# Patient Record
Sex: Male | Born: 1960 | Race: White | Hispanic: No | Marital: Married | Smoking: Former smoker
Health system: Southern US, Community
[De-identification: ages and names within clinical notes are randomized; demographics above are authoritative.]

## PROBLEM LIST (undated history)

## (undated) DIAGNOSIS — G4730 Sleep apnea, unspecified: Secondary | ICD-10-CM

## (undated) DIAGNOSIS — I10 Essential (primary) hypertension: Secondary | ICD-10-CM

## (undated) DIAGNOSIS — K219 Gastro-esophageal reflux disease without esophagitis: Secondary | ICD-10-CM

## (undated) HISTORY — PX: NO PAST SURGERIES: SHX2092

---

## 2010-08-21 ENCOUNTER — Ambulatory Visit (HOSPITAL_COMMUNITY)
Admission: RE | Admit: 2010-08-21 | Discharge: 2010-08-21 | Payer: Self-pay | Source: Home / Self Care | Attending: Internal Medicine | Admitting: Internal Medicine

## 2010-08-21 ENCOUNTER — Ambulatory Visit: Admit: 2010-08-21 | Payer: Self-pay | Admitting: Internal Medicine

## 2010-09-02 NOTE — Op Note (Signed)
  [name], [name]               ACCOUNT NO.:  [id number]  MEDICAL RECORD NO.:  [id number]          PATIENT TYPE:  AMB  LOCATION:  DAY                           FACILITY:  [facility]  PHYSICIAN:  Kurt Powell, M.D.    DATE OF BIRTH:  09-02-1960  DATE OF PROCEDURE:  08/21/2010 DATE OF DISCHARGE:                              OPERATIVE REPORT   PROCEDURE:  Colonoscopy with snare polypectomy.  INDICATION:  Kurt Powell is a 50 year old Caucasian male who is undergoing average risk screening colonoscopy.  Procedure risks were reviewed with the patient and informed consent was obtained.  MEDICATIONS FOR CONSCIOUS SEDATION: 1. Demerol 50 mg IV. 2. Versed 5 mg IV.  FINDINGS:  Procedure performed in endoscopy suite.  The patient's vital signs and O2 sat were monitored during the procedure and remained stable.  The patient was placed in left lateral recumbent position. Rectal examination performed.  No abnormality noted on external or digital exam.  Pentax videoscope was placed through rectum and advanced under vision into sigmoid colon beyond.  Preparation was excellent. There was a single diverticulum at sigmoid colon.  Scope was passed into cecum which was identified by appendiceal orifice and ileocecal valve. Pictures were taken for the record.  As the scope was withdrawn, colonic mucosa was carefully examined.  There was 4-mm polyp at distal transverse colon which was cold snared.  Rest of the mucosa was normal until back in the cecum where there was 12 to 13 mm lobulated polyp which was snared and retrieved for sludge examination.  Polypectomy was complete.  There was another small polyp at distal sigmoid colon which was coagulated.  Rest of the rectal mucosa was normal.  Anorectal junction was also examined by retroflexing scope and was normal. Endoscope was withdrawn.  Withdrawal time was 16 minutes.  The patient tolerated the procedure well.  FINAL DIAGNOSIS:  Examination performed to  cecum.  A single diverticulum at sigmoid colon.  A 4-mm polyp cold snared from transverse colon.  A 12 to 13 mm lobulated polyp snared from the rectum.  Small polyp at distal sigmoid colon was coagulated.  RECOMMENDATIONS:  No aspirin for 10 days.  I will be contacting the patient results of biopsy and further recommendations.     Kurt Powell, M.D.     NR/MEDQ  D:  08/21/2010  T:  08/22/2010  Job:  [id number]  cc:   Kirk Ruths, M.D. Fax: [phone]  Electronically Signed by Kurt Powell M.D. on 09/02/2010 12:17:17 PM

## 2011-07-29 ENCOUNTER — Ambulatory Visit (HOSPITAL_COMMUNITY)
Admission: RE | Admit: 2011-07-29 | Discharge: 2011-07-29 | Disposition: A | Discharge status: Home / Self Care | Payer: BC Managed Care – PPO | Source: Ambulatory Visit | Attending: Internal Medicine | Admitting: Internal Medicine

## 2011-07-29 ENCOUNTER — Other Ambulatory Visit (HOSPITAL_COMMUNITY): Payer: Self-pay | Admitting: Internal Medicine

## 2011-07-29 DIAGNOSIS — M76899 Other specified enthesopathies of unspecified lower limb, excluding foot: Secondary | ICD-10-CM

## 2011-07-29 DIAGNOSIS — M25569 Pain in unspecified knee: Secondary | ICD-10-CM | POA: Insufficient documentation

## 2015-08-27 ENCOUNTER — Encounter (INDEPENDENT_AMBULATORY_CARE_PROVIDER_SITE_OTHER): Payer: Self-pay | Admitting: *Deleted

## 2015-09-20 ENCOUNTER — Other Ambulatory Visit (INDEPENDENT_AMBULATORY_CARE_PROVIDER_SITE_OTHER): Payer: Self-pay | Admitting: *Deleted

## 2015-09-20 DIAGNOSIS — Z86010 Personal history of colonic polyps: Secondary | ICD-10-CM

## 2015-10-16 ENCOUNTER — Encounter (INDEPENDENT_AMBULATORY_CARE_PROVIDER_SITE_OTHER): Payer: Self-pay | Admitting: *Deleted

## 2015-10-16 ENCOUNTER — Other Ambulatory Visit (INDEPENDENT_AMBULATORY_CARE_PROVIDER_SITE_OTHER): Payer: Self-pay | Admitting: *Deleted

## 2015-10-16 MED ORDER — PEG-KCL-NACL-NASULF-NA ASC-C 100 G PO SOLR: 1.0000 | Freq: Once | ORAL | Status: DC

## 2015-10-16 NOTE — Telephone Encounter (Signed)
Patient needs movi prep 

## 2015-11-08 ENCOUNTER — Telehealth (INDEPENDENT_AMBULATORY_CARE_PROVIDER_SITE_OTHER): Payer: Self-pay | Admitting: *Deleted

## 2015-11-08 NOTE — Telephone Encounter (Signed)
agree

## 2015-11-08 NOTE — Telephone Encounter (Signed)
Referring MD/PCP: ben mann -- [facility]   Procedure: tcs  Reason/Indication:  Hx polyps  Has patient had this procedure before?  Yes, 2012 -- paper chart  If so, when, by whom and where?    Is there a family history of colon cancer?  no  Who?  What age when diagnosed?    Is patient diabetic?   no      Does patient have prosthetic heart valve or mechanical valve?  no  Do you have a pacemaker?  no  Has patient ever had endocarditis? no  Has patient had joint replacement within last 12 months?  no  Does patient tend to be constipated or take laxatives? no  Does patient have a history of alcohol/drug use?  no  Is patient on Coumadin, Plavix and/or Aspirin? no  Medications: amlodipine 5/20 mg daily, vitamins  Allergies: nkda  Medication Adjustment:   Procedure date & time: 11/29/15 at 930

## 2015-11-29 ENCOUNTER — Ambulatory Visit (HOSPITAL_COMMUNITY)
Admission: RE | Admit: 2015-11-29 | Discharge: 2015-11-29 | Disposition: A | Discharge status: Home / Self Care | Payer: BLUE CROSS/BLUE SHIELD | Source: Ambulatory Visit | Attending: Internal Medicine | Admitting: Internal Medicine

## 2015-11-29 ENCOUNTER — Encounter (HOSPITAL_COMMUNITY)
Admission: RE | Disposition: A | Discharge status: Home / Self Care | Payer: Self-pay | Source: Ambulatory Visit | Attending: Internal Medicine

## 2015-11-29 ENCOUNTER — Encounter (HOSPITAL_COMMUNITY): Payer: Self-pay

## 2015-11-29 DIAGNOSIS — K621 Rectal polyp: Secondary | ICD-10-CM | POA: Insufficient documentation

## 2015-11-29 DIAGNOSIS — Z1211 Encounter for screening for malignant neoplasm of colon: Secondary | ICD-10-CM | POA: Insufficient documentation

## 2015-11-29 DIAGNOSIS — I10 Essential (primary) hypertension: Secondary | ICD-10-CM | POA: Diagnosis not present

## 2015-11-29 DIAGNOSIS — Z09 Encounter for follow-up examination after completed treatment for conditions other than malignant neoplasm: Secondary | ICD-10-CM | POA: Diagnosis not present

## 2015-11-29 DIAGNOSIS — K5730 Diverticulosis of large intestine without perforation or abscess without bleeding: Secondary | ICD-10-CM | POA: Diagnosis not present

## 2015-11-29 DIAGNOSIS — K6389 Other specified diseases of intestine: Secondary | ICD-10-CM | POA: Diagnosis not present

## 2015-11-29 DIAGNOSIS — Z87891 Personal history of nicotine dependence: Secondary | ICD-10-CM | POA: Diagnosis not present

## 2015-11-29 DIAGNOSIS — Z86010 Personal history of colonic polyps: Secondary | ICD-10-CM | POA: Diagnosis not present

## 2015-11-29 DIAGNOSIS — Z79899 Other long term (current) drug therapy: Secondary | ICD-10-CM | POA: Diagnosis not present

## 2015-11-29 HISTORY — DX: Essential (primary) hypertension: I10

## 2015-11-29 HISTORY — PX: COLONOSCOPY: SHX5424

## 2015-11-29 SURGERY — COLONOSCOPY
Anesthesia: Moderate Sedation

## 2015-11-29 MED ORDER — MIDAZOLAM HCL 5 MG/5ML IJ SOLN
INTRAMUSCULAR | Status: DC | PRN
  Administered 2015-11-29 (×4): 2 mg via INTRAVENOUS
  Administered 2015-11-29: 1 mg via INTRAVENOUS

## 2015-11-29 MED ORDER — STERILE WATER FOR IRRIGATION IR SOLN
Status: DC | PRN
  Administered 2015-11-29: 09:00:00

## 2015-11-29 MED ORDER — MEPERIDINE HCL 50 MG/ML IJ SOLN
INTRAMUSCULAR | Status: AC
  Filled 2015-11-29: qty 1

## 2015-11-29 MED ORDER — MEPERIDINE HCL 50 MG/ML IJ SOLN
INTRAMUSCULAR | Status: DC | PRN
  Administered 2015-11-29 (×2): 25 mg via INTRAVENOUS

## 2015-11-29 MED ORDER — MIDAZOLAM HCL 5 MG/5ML IJ SOLN
INTRAMUSCULAR | Status: AC
  Filled 2015-11-29: qty 10

## 2015-11-29 MED ORDER — SODIUM CHLORIDE 0.9 % IV SOLN
INTRAVENOUS | Status: DC
  Administered 2015-11-29: 09:00:00 via INTRAVENOUS

## 2015-11-29 NOTE — H&P (Signed)
Kurt Powell is an 55 y.o. male.   Chief Complaint: Patient is here for colonoscopy. HPI: This 55 year old Caucasian male who is here for surveillance colonoscopy. He has history of colonic adenomas. 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 He denies abdominal pain until bleeding or change in bowel habits. Family History is negative for CRC.  Past Medical History  Diagnosis Date  . Hypertension     Past Surgical History  Procedure Laterality Date  . No past surgeries      History reviewed. No pertinent family history. Social History:  reports that he quit smoking about 33 years ago. He does not have any smokeless tobacco history on file. He reports that he does not drink alcohol or use illicit drugs.  Allergies: No Known Allergies  Medications Prior to Admission  Medication Sig Dispense Refill  . amLODipine-olmesartan (AZOR) 5-20 MG tablet Take 1 tablet by mouth daily.  0  . Ascorbic Acid (VITAMIN C PO) Take 1 tablet by mouth daily.    Marland Kitchen GARLIC PO Take 1 capsule by mouth daily.    . Omega-3 Fatty Acids (FISH OIL PO) Take 1 capsule by mouth daily.    . peg 3350 powder (MOVIPREP) 100 g SOLR Take 1 kit (200 g total) by mouth once. 1 kit 0    No results found for this or any previous visit (from the past 48 hour(s)). No results found.  ROS  Blood pressure 112/81, pulse 73, temperature 98.5 F (36.9 C), temperature source Oral, resp. rate 12, height 5' 7" (1.702 m), weight 230 lb (104.327 kg), SpO2 96 %. Physical Exam  Constitutional: He appears well-developed and well-nourished.  HENT:  Mouth/Throat: Oropharynx is clear and moist.  Eyes: Conjunctivae are normal. No scleral icterus.  Neck: No thyromegaly present.  Cardiovascular: Normal rate, regular rhythm and normal heart sounds.   No murmur heard. Respiratory: Effort normal and breath sounds normal.  GI: Soft. He exhibits  no distension and no mass. There is no tenderness.  Musculoskeletal: He exhibits no edema.  Lymphadenopathy:    He has no cervical adenopathy.  Neurological: He is alert.  Skin: Skin is warm and dry.     Assessment/Plan History of colonic adenomas. Surveillance colonoscopy.  Rogene Houston, MD 11/29/2015, 9:21 AM

## 2015-11-29 NOTE — Discharge Instructions (Signed)
Resume usual medications and high fiber diet. No driving for 24 hours. Physician will call with biopsy results.    Colonoscopy, Care After These instructions give you information on caring for yourself after your procedure. Your doctor may also give you more specific instructions. Call your doctor if you have any problems or questions after your procedure. HOME CARE  Do not drive for 24 hours.  Do not sign important papers or use machinery for 24 hours.  You may shower.  You may go back to your usual activities, but go slower for the first 24 hours.  Take rest breaks often during the first 24 hours.  Walk around or use warm packs on your belly (abdomen) if you have belly cramping or gas.  Drink enough fluids to keep your pee (urine) clear or pale yellow.  Resume your normal diet. Avoid heavy or fried foods.  Avoid drinking alcohol for 24 hours or as told by your doctor.  Only take medicines as told by your doctor. If a tissue sample (biopsy) was taken during the procedure:   Do not take aspirin or blood thinners for 7 days, or as told by your doctor.  Do not drink alcohol for 7 days, or as told by your doctor.  Eat soft foods for the first 24 hours. GET HELP IF: You still have a small amount of blood in your poop (stool) 2-3 days after the procedure. GET HELP RIGHT AWAY IF:  You have more than a small amount of blood in your poop.  You see clumps of tissue (blood clots) in your poop.  Your belly is puffy (swollen).  You feel sick to your stomach (nauseous) or throw up (vomit).  You have a fever.  You have belly pain that gets worse and medicine does not help. MAKE SURE YOU:  Understand these instructions.  Will watch your condition.  Will get help right away if you are not doing well or get worse.   This information is not intended to replace advice given to you by your health care provider. Make sure you discuss any questions you have with your health care  provider.   Document Released: 08/30/2010 Document Revised: 08/02/2013 Document Reviewed: 04/04/2013 Elsevier Interactive Patient Education 2016 Elsevier Inc.    High-Fiber Diet Fiber, also called dietary fiber, is a type of carbohydrate found in fruits, vegetables, whole grains, and beans. A high-fiber diet can have many health benefits. Your health care provider may recommend a high-fiber diet to help:  Prevent constipation. Fiber can make your bowel movements more regular.  Lower your cholesterol.  Relieve hemorrhoids, uncomplicated diverticulosis, or irritable bowel syndrome.  Prevent overeating as part of a weight-loss plan.  Prevent heart disease, type 2 diabetes, and certain cancers. WHAT IS MY PLAN? The recommended daily intake of fiber includes:  38 grams for men under age 90.  7 grams for men over age 55.  3 grams for women under age 27.  55 grams for women over age 3. You can get the recommended daily intake of dietary fiber by eating a variety of fruits, vegetables, grains, and beans. Your health care provider may also recommend a fiber supplement if it is not possible to get enough fiber through your diet. WHAT DO I NEED TO KNOW ABOUT A HIGH-FIBER DIET?  Fiber supplements have not been widely studied for their effectiveness, so it is better to get fiber through food sources.  Always check the fiber content on thenutrition facts label of any prepackaged  food. Look for foods that contain at least 5 grams of fiber per serving.  Ask your dietitian if you have questions about specific foods that are related to your condition, especially if those foods are not listed in the following section.  Increase your daily fiber consumption gradually. Increasing your intake of dietary fiber too quickly may cause bloating, cramping, or gas.  Drink plenty of water. Water helps you to digest fiber. WHAT FOODS CAN I EAT? Grains Whole-grain breads. Multigrain cereal. Oats and  oatmeal. Brown rice. Barley. Bulgur wheat. Gurley. Bran muffins. Popcorn. Rye wafer crackers. Vegetables Sweet potatoes. Spinach. Kale. Artichokes. Cabbage. Broccoli. Green peas. Carrots. Squash. Fruits Berries. Pears. Apples. Oranges. Avocados. Prunes and raisins. Dried figs. Meats and Other Protein Sources Navy, kidney, pinto, and soy beans. Split peas. Lentils. Nuts and seeds. Dairy Fiber-fortified yogurt. Beverages Fiber-fortified soy milk. Fiber-fortified orange juice. Other Fiber bars. The items listed above may not be a complete list of recommended foods or beverages. Contact your dietitian for more options. WHAT FOODS ARE NOT RECOMMENDED? Grains White bread. Pasta made with refined flour. White rice. Vegetables Fried potatoes. Canned vegetables. Well-cooked vegetables.  Fruits Fruit juice. Cooked, strained fruit. Meats and Other Protein Sources Fatty cuts of meat. Fried Sales executive or fried fish. Dairy Milk. Yogurt. Cream cheese. Sour cream. Beverages Soft drinks. Other Cakes and pastries. Butter and oils. The items listed above may not be a complete list of foods and beverages to avoid. Contact your dietitian for more information. WHAT ARE SOME TIPS FOR INCLUDING HIGH-FIBER FOODS IN MY DIET?  Eat a wide variety of high-fiber foods.  Make sure that half of all grains consumed each day are whole grains.  Replace breads and cereals made from refined flour or white flour with whole-grain breads and cereals.  Replace white rice with brown rice, bulgur wheat, or millet.  Start the day with a breakfast that is high in fiber, such as a cereal that contains at least 5 grams of fiber per serving.  Use beans in place of meat in soups, salads, or pasta.  Eat high-fiber snacks, such as berries, raw vegetables, nuts, or popcorn.   This information is not intended to replace advice given to you by your health care provider. Make sure you discuss any questions you have with your  health care provider.   Document Released: 07/28/2005 Document Revised: 08/18/2014 Document Reviewed: 01/10/2014 Elsevier Interactive Patient Education 2016 Elsevier Inc.   Colon Polyps Polyps are lumps of extra tissue growing inside the body. Polyps can grow in the large intestine (colon). Most colon polyps are noncancerous (benign). However, some colon polyps can become cancerous over time. Polyps that are larger than a pea may be harmful. To be safe, caregivers remove and test all polyps. CAUSES  Polyps form when mutations in the genes cause your cells to grow and divide even though no more tissue is needed. RISK FACTORS There are a number of risk factors that can increase your chances of getting colon polyps. They include:  Being older than 50 years.  Family history of colon polyps or colon cancer.  Long-term colon diseases, such as colitis or Crohn disease.  Being overweight.  Smoking.  Being inactive.  Drinking too much alcohol. SYMPTOMS  Most small polyps do not cause symptoms. If symptoms are present, they may include:  Blood in the stool. The stool may look dark red or black.  Constipation or diarrhea that lasts longer than 1 week. DIAGNOSIS People often do not know they  have polyps until their caregiver finds them during a regular checkup. Your caregiver can use 4 tests to check for polyps:  Digital rectal exam. The caregiver wears gloves and feels inside the rectum. This test would find polyps only in the rectum.  Barium enema. The caregiver puts a liquid called barium into your rectum before taking X-rays of your colon. Barium makes your colon look white. Polyps are dark, so they are easy to see in the X-ray pictures.  Sigmoidoscopy. A thin, flexible tube (sigmoidoscope) is placed into your rectum. The sigmoidoscope has a light and tiny camera in it. The caregiver uses the sigmoidoscope to look at the last third of your colon.  Colonoscopy. This test is like  sigmoidoscopy, but the caregiver looks at the entire colon. This is the most common method for finding and removing polyps. TREATMENT  Any polyps will be removed during a sigmoidoscopy or colonoscopy. The polyps are then tested for cancer. PREVENTION  To help lower your risk of getting more colon polyps:  Eat plenty of fruits and vegetables. Avoid eating fatty foods.  Do not smoke.  Avoid drinking alcohol.  Exercise every day.  Lose weight if recommended by your caregiver.  Eat plenty of calcium and folate. Foods that are rich in calcium include milk, cheese, and broccoli. Foods that are rich in folate include chickpeas, kidney beans, and spinach. HOME CARE INSTRUCTIONS Keep all follow-up appointments as directed by your caregiver. You may need periodic exams to check for polyps. SEEK MEDICAL CARE IF: You notice bleeding during a bowel movement.   This information is not intended to replace advice given to you by your health care provider. Make sure you discuss any questions you have with your health care provider.   Document Released: 04/23/2004 Document Revised: 08/18/2014 Document Reviewed: 10/07/2011 Elsevier Interactive Patient Education [employer].

## 2015-11-29 NOTE — Op Note (Signed)
[facility] Patient Name: Kurt Powell Procedure Date: 11/29/2015 9:17 AM MRN: [id number] Date of Birth: 1961/01/24 Attending MD: Hildred Laser , MD CSN: [id number] Age: 55 Admit Type: Outpatient Procedure:                Colonoscopy Indications:              Surveillance: Personal history of adenomatous                            polyps on last colonoscopy 5 years ago Providers:                Hildred Laser, MD, Rosina Lowenstein, RN, Randa Spike, Technician Referring MD:             Collene Mares, PA Medicines:                Meperidine 50 mg IV, Midazolam 9 mg IV Complications:            No immediate complications. Estimated Blood Loss:     Estimated blood loss was minimal. Procedure:                Pre-Anesthesia Assessment:                           - Prior to the procedure, a History and Physical                            was performed, and patient medications and                            allergies were reviewed. The patient's tolerance of                            previous anesthesia was also reviewed. The risks                            and benefits of the procedure and the sedation                            options and risks were discussed with the patient.                            All questions were answered, and informed consent                            was obtained. Prior Anticoagulants: The patient has                            taken no previous anticoagulant or antiplatelet                            agents. ASA Grade Assessment: I - A normal, healthy  patient. After reviewing the risks and benefits,                            the patient was deemed in satisfactory condition to                            undergo the procedure.                           After obtaining informed consent, the colonoscope                            was passed under direct vision. Throughout the   procedure, the patient's blood pressure, pulse, and                            oxygen saturations were monitored continuously. The                            [id number]) scope was introduced through                            the anus and advanced to the the terminal ileum.                            The colonoscopy was performed without difficulty.                            The patient tolerated the procedure well. The                            quality of the bowel preparation was adequate. The                            terminal ileum, ileocecal valve, appendiceal                            orifice, and rectum were photographed. Scope In: 9:31:13 AM Scope Out: 9:58:38 AM Scope Withdrawal Time: 0 hours 18 minutes 44 seconds  Total Procedure Duration: 0 hours 27 minutes 25 seconds  Findings:      The terminal ileum appeared normal.      A localized area of mildly erythematous mucosa was found at the       ileocecal valve. This was biopsied with a cold forceps for histology.      A few medium-mouthed diverticula were found in the sigmoid colon.      Two sessile polyps were found in the rectum. The polyps were 4 to 5 mm       in size. These polyps were removed with a cold snare. Resection and       retrieval were complete.      The retroflexed view of the distal rectum and anal verge was normal and       showed no anal or rectal abnormalities. Impression:               - The examined portion of the  ileum was normal.                           - Erythematous mucosa at the ileocecal valve.                            Biopsied.                           - Diverticulosis in the sigmoid colon.                           - Two 4 to 5 mm polyps in the rectum, removed with                            a cold snare. Resected and retrieved.                           - The distal rectum and anal verge are normal on                            retroflexion view. Moderate Sedation:       Moderate (conscious) sedation was administered by the endoscopy nurse       and supervised by the endoscopist. The following parameters were       monitored: oxygen saturation, heart rate, blood pressure, CO2       capnography and response to care. Total physician intraservice time was       33 minutes. Recommendation:           - Patient has a contact number available for                            emergencies. The signs and symptoms of potential                            delayed complications were discussed with the                            patient. Return to normal activities tomorrow.                            Written discharge instructions were provided to the                            patient.                           - High fiber diet today.                           - Continue present medications.                           - Repeat colonoscopy date to be determined after  pending pathology results are reviewed for                            surveillance based on pathology results. Procedure Code(s):        --- Professional ---                           [phone], Colonoscopy, flexible; with removal of                            tumor(s), polyp(s), or other lesion(s) by snare                            technique                           45380, 59, Colonoscopy, flexible; with biopsy,                            single or multiple                           99152, Moderate sedation services provided by the                            same physician or other qualified health care                            professional performing the diagnostic or                            therapeutic service that the sedation supports,                            requiring the presence of an independent trained                            observer to assist in the monitoring of the                            patient's level of consciousness and physiological                             status; initial 15 minutes of intraservice time,                            patient age 60 years or older                           [phone], Moderate sedation services; each additional                            15 minutes intraservice time Diagnosis Code(s):        --- Professional ---  Z86.010, Personal history of colonic polyps                           K63.89, Other specified diseases of intestine                           K62.1, Rectal polyp                           K57.30, Diverticulosis of large intestine without                            perforation or abscess without bleeding CPT copyright 2016 American Medical Association. All rights reserved. The codes documented in this report are preliminary and upon coder review may  be revised to meet current compliance requirements. Hildred Laser, MD Hildred Laser, MD 11/29/2015 10:06:51 AM This report has been signed electronically. Number of Addenda: 0

## 2015-11-30 ENCOUNTER — Encounter (HOSPITAL_COMMUNITY): Payer: Self-pay | Admitting: Internal Medicine

## 2015-12-24 DIAGNOSIS — H6692 Otitis media, unspecified, left ear: Secondary | ICD-10-CM | POA: Diagnosis not present

## 2015-12-24 DIAGNOSIS — K219 Gastro-esophageal reflux disease without esophagitis: Secondary | ICD-10-CM | POA: Diagnosis not present

## 2015-12-24 DIAGNOSIS — Z1389 Encounter for screening for other disorder: Secondary | ICD-10-CM | POA: Diagnosis not present

## 2015-12-24 DIAGNOSIS — H6123 Impacted cerumen, bilateral: Secondary | ICD-10-CM | POA: Diagnosis not present

## 2015-12-24 DIAGNOSIS — Z6835 Body mass index (BMI) 35.0-35.9, adult: Secondary | ICD-10-CM | POA: Diagnosis not present

## 2016-02-08 DIAGNOSIS — I10 Essential (primary) hypertension: Secondary | ICD-10-CM | POA: Diagnosis not present

## 2016-02-08 DIAGNOSIS — H8113 Benign paroxysmal vertigo, bilateral: Secondary | ICD-10-CM | POA: Diagnosis not present

## 2016-02-08 DIAGNOSIS — K219 Gastro-esophageal reflux disease without esophagitis: Secondary | ICD-10-CM | POA: Diagnosis not present

## 2016-02-08 DIAGNOSIS — J309 Allergic rhinitis, unspecified: Secondary | ICD-10-CM | POA: Diagnosis not present

## 2016-02-08 DIAGNOSIS — Z1389 Encounter for screening for other disorder: Secondary | ICD-10-CM | POA: Diagnosis not present

## 2016-02-08 DIAGNOSIS — Z6835 Body mass index (BMI) 35.0-35.9, adult: Secondary | ICD-10-CM | POA: Diagnosis not present

## 2016-02-27 DIAGNOSIS — R42 Dizziness and giddiness: Secondary | ICD-10-CM | POA: Diagnosis not present

## 2016-09-16 ENCOUNTER — Encounter: Payer: Self-pay | Admitting: Internal Medicine

## 2017-01-16 DIAGNOSIS — G4733 Obstructive sleep apnea (adult) (pediatric): Secondary | ICD-10-CM | POA: Diagnosis not present

## 2017-01-16 DIAGNOSIS — Z1389 Encounter for screening for other disorder: Secondary | ICD-10-CM | POA: Diagnosis not present

## 2017-01-16 DIAGNOSIS — D179 Benign lipomatous neoplasm, unspecified: Secondary | ICD-10-CM | POA: Diagnosis not present

## 2017-01-16 DIAGNOSIS — Z6834 Body mass index (BMI) 34.0-34.9, adult: Secondary | ICD-10-CM | POA: Diagnosis not present

## 2017-01-16 DIAGNOSIS — R7301 Impaired fasting glucose: Secondary | ICD-10-CM | POA: Diagnosis not present

## 2017-01-16 DIAGNOSIS — Z0001 Encounter for general adult medical examination with abnormal findings: Secondary | ICD-10-CM | POA: Diagnosis not present

## 2017-01-16 DIAGNOSIS — Z23 Encounter for immunization: Secondary | ICD-10-CM | POA: Diagnosis not present

## 2017-01-27 DIAGNOSIS — D225 Melanocytic nevi of trunk: Secondary | ICD-10-CM | POA: Diagnosis not present

## 2017-01-27 DIAGNOSIS — Z1283 Encounter for screening for malignant neoplasm of skin: Secondary | ICD-10-CM | POA: Diagnosis not present

## 2017-02-17 ENCOUNTER — Ambulatory Visit (INDEPENDENT_AMBULATORY_CARE_PROVIDER_SITE_OTHER): Payer: BLUE CROSS/BLUE SHIELD | Admitting: General Surgery

## 2017-02-17 ENCOUNTER — Encounter: Payer: Self-pay | Admitting: General Surgery

## 2017-02-17 VITALS — BP 153/94 | HR 80 | Temp 98.7°F | Resp 18 | Ht 67.0 in | Wt 236.0 lb

## 2017-02-17 DIAGNOSIS — D171 Benign lipomatous neoplasm of skin and subcutaneous tissue of trunk: Secondary | ICD-10-CM | POA: Diagnosis not present

## 2017-02-17 NOTE — Progress Notes (Signed)
Kurt Powell; [id number]; 09/16/1960   HPI Patient is a 56-year-old white male who was referred to my care for evaluation treatment of a subcutaneous nodule on his right flank. It is been present for some time. It seems to different size over the past few years. It is occasionally tender to touch. Only when pressure is applied. He states there may have been some growth over the past year, but he is not certain. No drainage has been noted. He currently has no pain. Past Medical History:  Diagnosis Date  . Hypertension     Past Surgical History:  Procedure Laterality Date  . COLONOSCOPY N/A 11/29/2015   Procedure: COLONOSCOPY;  Surgeon: Najeeb U Rehman, MD;  Location: [facility];  Service: Endoscopy;  Laterality: N/A;  930  . NO PAST SURGERIES      History reviewed. No pertinent family history.  Current Outpatient Prescriptions on File Prior to Visit  Medication Sig Dispense Refill  . amLODipine-olmesartan (AZOR) 5-20 MG tablet Take 1 tablet by mouth daily.  0  . Ascorbic Acid (VITAMIN C PO) Take 1 tablet by mouth daily.    . GARLIC PO Take 1 capsule by mouth daily.    . Omega-3 Fatty Acids (FISH OIL PO) Take 1 capsule by mouth daily.     No current facility-administered medications on file prior to visit.     No Known Allergies  History  Alcohol Use No    History  Smoking Status  . Former Smoker  . Quit date: 11/29/1982  Smokeless Tobacco  . Never Used    Review of Systems  Constitutional: Negative.   HENT: Negative.   Eyes: Negative.   Respiratory:       Uses CPAP machine  Cardiovascular: Negative.   Gastrointestinal: Negative.   Genitourinary: Negative.   Musculoskeletal: Negative.   Skin: Positive for rash.  Neurological: Positive for dizziness.  Endo/Heme/Allergies: Negative.   Psychiatric/Behavioral: Negative.     Objective   Vitals:   02/17/17 1001  BP: (!) 153/94  Pulse: 80  Resp: 18  Temp: 98.7 F (37.1 C)    Physical Exam   Constitutional: He is oriented to person, place, and time and well-developed, well-nourished, and in no distress.  HENT:  Head: Normocephalic and atraumatic.  Neck: Normal range of motion. Neck supple.  Cardiovascular: Normal rate, regular rhythm and normal heart sounds.   No murmur heard. Pulmonary/Chest: Effort normal and breath sounds normal. He has no wheezes. He has no rales.  Abdominal: Soft. He exhibits mass. He exhibits no distension. There is no tenderness.  6 x 8 cm ovoid subcutaneous rubbery mass present along right flank. Nontender.  Neurological: He is alert and oriented to person, place, and time.  Vitals reviewed.   Assessment  Large lipoma, right flank Plan   I told the patient that the risk of this being malignant was very small. That being said, should he have increased enlargement, he should consider removal. He would like to think about it and will call me to schedule the surgery. Literature given.  

## 2017-02-17 NOTE — Patient Instructions (Signed)
Lipoma Removal Lipoma removal is a surgical procedure to remove a noncancerous (benign) tumor that is made up of fat cells (lipoma). Most lipomas are small and painless and do not require treatment. They can form in many areas of the body but are most common under the skin of the back, shoulders, arms, and thighs. You may need lipoma removal if you have a lipoma that is large, growing, or causing discomfort. Lipoma removal may also be done for cosmetic reasons. Tell a health care provider about:  Any allergies you have.  All medicines you are taking, including vitamins, herbs, eye drops, creams, and over-the-counter medicines.  Any problems you or family members have had with anesthetic medicines.  Any blood disorders you have.  Any surgeries you have had.  Any medical conditions you have.  Whether you are pregnant or may be pregnant. What are the risks? Generally, this is a safe procedure. However, problems may occur, including:  Infection.  Bleeding.  Allergic reactions to medicines.  Damage to nerves or blood vessels near the lipoma.  Scarring.  What happens before the procedure? Staying hydrated Follow instructions from your health care provider about hydration, which may include:  Up to 2 hours before the procedure - you may continue to drink clear liquids, such as water, clear fruit juice, black coffee, and plain tea.  Eating and drinking restrictions Follow instructions from your health care provider about eating and drinking, which may include:  8 hours before the procedure - stop eating heavy meals or foods such as meat, fried foods, or fatty foods.  6 hours before the procedure - stop eating light meals or foods, such as toast or cereal.  6 hours before the procedure - stop drinking milk or drinks that contain milk.  2 hours before the procedure - stop drinking clear liquids.  Medicines  Ask your health care provider about: ? Changing or stopping your  regular medicines. This is especially important if you are taking diabetes medicines or blood thinners. ? Taking medicines such as aspirin and ibuprofen. These medicines can thin your blood. Do not take these medicines before your procedure if your health care provider instructs you not to.  You may be given antibiotic medicine to help prevent infection. General instructions  Ask your health care provider how your surgical site will be marked or identified.  You will have a physical exam. Your health care provider will check the size of the lipoma and whether it can be moved easily.  You may have imaging tests, such as: ? X-rays. ? CT scan. ? MRI.  Plan to have someone take you home from the hospital or clinic. What happens during the procedure?  To reduce your risk of infection: ? Your health care team will wash or sanitize their hands. ? Your skin will be washed with soap.  You will be given one or more of the following: ? A medicine to help you relax (sedative). ? A medicine to numb the area (local anesthetic). ? A medicine to make you fall asleep (general anesthetic). ? A medicine that is injected into an area of your body to numb everything below the injection site (regional anesthetic).  An incision will be made over the lipoma or very near the lipoma. The incision may be made in a natural skin line or crease.  Tissues, nerves, and blood vessels near the lipoma will be moved out of the way.  The lipoma and the capsule that surrounds it will be separated  from the surrounding tissues.  The lipoma will be removed.  The incision may be closed with stitches (sutures).  A bandage (dressing) will be placed over the incision. What happens after the procedure?  Do not drive for 24 hours if you received a sedative.  Your blood pressure, heart rate, breathing rate, and blood oxygen level will be monitored until the medicines you were given have worn off. This information is not  intended to replace advice given to you by your health care provider. Make sure you discuss any questions you have with your health care provider. Document Released: 10/11/2015 Document Revised: 01/03/2016 Document Reviewed: 10/11/2015 Elsevier Interactive Patient Education  2018 [employer] Lipoma A lipoma is a noncancerous (benign) tumor that is made up of fat cells. This is a very common type of soft-tissue growth. Lipomas are usually found under the skin (subcutaneous). They may occur in any tissue of the body that contains fat. Common areas for lipomas to appear include the back, shoulders, buttocks, and thighs. Lipomas grow slowly, and they are usually painless. Most lipomas do not cause problems and do not require treatment. What are the causes? The cause of this condition is not known. What increases the risk? This condition is more likely to develop in:  People who are 40-60 years old.  People who have a family history of lipomas.  What are the signs or symptoms? A lipoma usually appears as a small, round bump under the skin. It may feel soft or rubbery, but the firmness can vary. Most lipomas are not painful. However, a lipoma may become painful if it is located in an area where it pushes on nerves. How is this diagnosed? A lipoma can usually be diagnosed with a physical exam. You may also have tests to confirm the diagnosis and to rule out other conditions. Tests may include:  Imaging tests, such as a CT scan or MRI.  Removal of a tissue sample to be looked at under a microscope (biopsy).  How is this treated? Treatment is not needed for small lipomas that are not causing problems. If a lipoma continues to get bigger or it causes problems, removal is often the best option. Lipomas can also be removed to improve appearance. Removal of a lipoma is usually done with a surgery in which the fatty cells and the surrounding capsule are removed. Most often, a medicine that numbs the  area (local anesthetic) is used for this procedure. Follow these instructions at home:  Keep all follow-up visits as directed by your health care provider. This is important. Contact a health care provider if:  Your lipoma becomes larger or hard.  Your lipoma becomes painful, red, or increasingly swollen. These could be signs of infection or a more serious condition. This information is not intended to replace advice given to you by your health care provider. Make sure you discuss any questions you have with your health care provider. Document Released: 07/18/2002 Document Revised: 01/03/2016 Document Reviewed: 07/24/2014 [employer] Interactive Patient Education  2018 [employer]  

## 2017-04-14 ENCOUNTER — Ambulatory Visit (INDEPENDENT_AMBULATORY_CARE_PROVIDER_SITE_OTHER): Payer: BLUE CROSS/BLUE SHIELD | Admitting: Urology

## 2017-04-14 DIAGNOSIS — R972 Elevated prostate specific antigen [PSA]: Secondary | ICD-10-CM | POA: Diagnosis not present

## 2017-04-29 ENCOUNTER — Other Ambulatory Visit: Payer: Self-pay | Admitting: Urology

## 2017-04-29 DIAGNOSIS — N4342 Spermatocele of epididymis, multiple: Secondary | ICD-10-CM

## 2017-05-04 ENCOUNTER — Other Ambulatory Visit: Payer: Self-pay | Admitting: Urology

## 2017-05-04 DIAGNOSIS — N4342 Spermatocele of epididymis, multiple: Secondary | ICD-10-CM

## 2017-05-06 ENCOUNTER — Ambulatory Visit (HOSPITAL_COMMUNITY)
Admission: RE | Admit: 2017-05-06 | Discharge: 2017-05-06 | Disposition: A | Discharge status: Home / Self Care | Payer: BLUE CROSS/BLUE SHIELD | Source: Ambulatory Visit | Attending: Urology | Admitting: Urology

## 2017-05-06 DIAGNOSIS — N503 Cyst of epididymis: Secondary | ICD-10-CM | POA: Diagnosis not present

## 2017-05-06 DIAGNOSIS — N4342 Spermatocele of epididymis, multiple: Secondary | ICD-10-CM

## 2017-05-06 DIAGNOSIS — N433 Hydrocele, unspecified: Secondary | ICD-10-CM | POA: Insufficient documentation

## 2017-05-29 NOTE — Patient Instructions (Signed)
Kurt Powell  05/29/2017     @PREFPERIOPPHARMACY @   Your procedure is scheduled on  06/05/2017   Report to [facility] at  710  A.M.  Call this number if you have problems the morning of surgery:  [phone]   Remember:  Do not eat food or drink liquids after midnight.  Take these medicines the morning of surgery with A SIP OF WATER  azor   Do not wear jewelry, make-up or nail polish.  Do not wear lotions, powders, or perfumes, or deoderant.  Do not shave 48 hours prior to surgery.  Men may shave face and neck.  Do not bring valuables to the hospital.  [facility] is not responsible for any belongings or valuables.  Contacts, dentures or bridgework may not be worn into surgery.  Leave your suitcase in the car.  After surgery it may be brought to your room.  For patients admitted to the hospital, discharge time will be determined by your treatment team.  Patients discharged the day of surgery will not be allowed to drive home.   Name and phone number of your driver:   family Special instructions:   None  Please read over the following fact sheets that you were given. Anesthesia Post-op Instructions and Care and Recovery After Surgery       Lipoma Removal Lipoma removal is a surgical procedure to remove a noncancerous (benign) tumor that is made up of fat cells (lipoma). Most lipomas are small and painless and do not require treatment. They can form in many areas of the body but are most common under the skin of the back, shoulders, arms, and thighs. You may need lipoma removal if you have a lipoma that is large, growing, or causing discomfort. Lipoma removal may also be done for cosmetic reasons. Tell a health care provider about:  Any allergies you have.  All medicines you are taking, including vitamins, herbs, eye drops, creams, and over-the-counter medicines.  Any problems you or family members have had with anesthetic medicines.  Any blood  disorders you have.  Any surgeries you have had.  Any medical conditions you have.  Whether you are pregnant or may be pregnant. What are the risks? Generally, this is a safe procedure. However, problems may occur, including:  Infection.  Bleeding.  Allergic reactions to medicines.  Damage to nerves or blood vessels near the lipoma.  Scarring.  What happens before the procedure? Staying hydrated Follow instructions from your health care provider about hydration, which may include:  Up to 2 hours before the procedure - you may continue to drink clear liquids, such as water, clear fruit juice, black coffee, and plain tea.  Eating and drinking restrictions Follow instructions from your health care provider about eating and drinking, which may include:  8 hours before the procedure - stop eating heavy meals or foods such as meat, fried foods, or fatty foods.  6 hours before the procedure - stop eating light meals or foods, such as toast or cereal.  6 hours before the procedure - stop drinking milk or drinks that contain milk.  2 hours before the procedure - stop drinking clear liquids.  Medicines  Ask your health care provider about: ? Changing or stopping your regular medicines. This is especially important if you are taking diabetes medicines or blood thinners. ? Taking medicines such as aspirin and ibuprofen. These medicines can thin your blood. Do not take  these medicines before your procedure if your health care provider instructs you not to.  You may be given antibiotic medicine to help prevent infection. General instructions  Ask your health care provider how your surgical site will be marked or identified.  You will have a physical exam. Your health care provider will check the size of the lipoma and whether it can be moved easily.  You may have imaging tests, such as: ? X-rays. ? CT scan. ? MRI.  Plan to have someone take you home from the hospital or  clinic. What happens during the procedure?  To reduce your risk of infection: ? Your health care team will wash or sanitize their hands. ? Your skin will be washed with soap.  You will be given one or more of the following: ? A medicine to help you relax (sedative). ? A medicine to numb the area (local anesthetic). ? A medicine to make you fall asleep (general anesthetic). ? A medicine that is injected into an area of your body to numb everything below the injection site (regional anesthetic).  An incision will be made over the lipoma or very near the lipoma. The incision may be made in a natural skin line or crease.  Tissues, nerves, and blood vessels near the lipoma will be moved out of the way.  The lipoma and the capsule that surrounds it will be separated from the surrounding tissues.  The lipoma will be removed.  The incision may be closed with stitches (sutures).  A bandage (dressing) will be placed over the incision. What happens after the procedure?  Do not drive for 24 hours if you received a sedative.  Your blood pressure, heart rate, breathing rate, and blood oxygen level will be monitored until the medicines you were given have worn off. This information is not intended to replace advice given to you by your health care provider. Make sure you discuss any questions you have with your health care provider. Document Released: 10/11/2015 Document Revised: 01/03/2016 Document Reviewed: 10/11/2015 Elsevier Interactive Patient Education  2018 [place].  Lipoma Removal, Care After Refer to this sheet in the next few weeks. These instructions provide you with information about caring for yourself after your procedure. Your health care provider may also give you more specific instructions. Your treatment has been planned according to current medical practices, but problems sometimes occur. Call your health care provider if you have any problems or questions after your  procedure. What can I expect after the procedure? After the procedure, it is common to have:  Mild pain.  Swelling.  Bruising.  Follow these instructions at home:  Bathing  Do not take baths, swim, or use a hot tub until your health care provider approves. Ask your health care provider if you can take showers. You may only be allowed to take sponge baths for bathing.  Keep your bandage (dressing) dry until your health care provider says it can be removed. Incision care   Follow instructions from your health care provider about how to take care of your incision. Make sure you: ? Wash your hands with soap and water before you change your bandage (dressing). If soap and water are not available, use hand sanitizer. ? Change your dressing as told by your health care provider. ? Leave stitches (sutures), skin glue, or adhesive strips in place. These skin closures may need to stay in place for 2 weeks or longer. If adhesive strip edges start to loosen and curl up,  you may trim the loose edges. Do not remove adhesive strips completely unless your health care provider tells you to do that.  Check your incision area every day for signs of infection. Check for: ? More redness, swelling, or pain. ? Fluid or blood. ? Warmth. ? Pus or a bad smell. Driving  Do not drive or operate heavy machinery while taking prescription pain medicine.  Do not drive for 24 hours if you received a medicine to help you relax (sedative) during your procedure.  Ask your health care provider when it is safe for you to drive. General instructions  Take over-the-counter and prescription medicines only as told by your health care provider.  Do not use any tobacco products, such as cigarettes, chewing tobacco, and e-cigarettes. These can delay healing. If you need help quitting, ask your health care provider.  Return to your normal activities as told by your health care provider. Ask your health care provider  what activities are safe for you.  Keep all follow-up visits as told by your health care provider. This is important. Contact a health care provider if:  You have more redness, swelling, or pain around your incision.  You have fluid or blood coming from your incision.  Your incision feels warm to the touch.  You have pus or a bad smell coming from your incision.  You have pain that does not get better with medicine. Get help right away if:  You have chills or a fever.  You have severe pain. This information is not intended to replace advice given to you by your health care provider. Make sure you discuss any questions you have with your health care provider. Document Released: 10/11/2015 Document Revised: 01/08/2016 Document Reviewed: 10/11/2015 Elsevier Interactive Patient Education  2018 [place] Anesthesia, Adult General anesthesia is the use of medicines to make a person "go to sleep" (be unconscious) for a medical procedure. General anesthesia is often recommended when a procedure:  Is long.  Requires you to be still or in an unusual position.  Is major and can cause you to lose blood.  Is impossible to do without general anesthesia.  The medicines used for general anesthesia are called general anesthetics. In addition to making you sleep, the medicines:  Prevent pain.  Control your blood pressure.  Relax your muscles.  Tell a health care provider about:  Any allergies you have.  All medicines you are taking, including vitamins, herbs, eye drops, creams, and over-the-counter medicines.  Any problems you or family members have had with anesthetic medicines.  Types of anesthetics you have had in the past.  Any bleeding disorders you have.  Any surgeries you have had.  Any medical conditions you have.  Any history of heart or lung conditions, such as heart failure, sleep apnea, or chronic obstructive pulmonary disease (COPD).  Whether you  are pregnant or may be pregnant.  Whether you use tobacco, alcohol, marijuana, or street drugs.  Any history of Armed forces logistics/support/administrative officer.  Any history of depression or anxiety. What are the risks? Generally, this is a safe procedure. However, problems may occur, including:  Allergic reaction to anesthetics.  Lung and heart problems.  Inhaling food or liquids from your stomach into your lungs (aspiration).  Injury to nerves.  Waking up during your procedure and being unable to move (rare).  Extreme agitation or a state of mental confusion (delirium) when you wake up from the anesthetic.  Air in the bloodstream, which can lead to  stroke.  These problems are more likely to develop if you are having a major surgery or if you have an advanced medical condition. You can prevent some of these complications by answering all of your health care provider's questions thoroughly and by following all pre-procedure instructions. General anesthesia can cause side effects, including:  Nausea or vomiting  A sore throat from the breathing tube.  Feeling cold or shivery.  Feeling tired, washed out, or achy.  Sleepiness or drowsiness.  Confusion or agitation.  What happens before the procedure? Staying hydrated Follow instructions from your health care provider about hydration, which may include:  Up to 2 hours before the procedure - you may continue to drink clear liquids, such as water, clear fruit juice, black coffee, and plain tea.  Eating and drinking restrictions Follow instructions from your health care provider about eating and drinking, which may include:  8 hours before the procedure - stop eating heavy meals or foods such as meat, fried foods, or fatty foods.  6 hours before the procedure - stop eating light meals or foods, such as toast or cereal.  6 hours before the procedure - stop drinking milk or drinks that contain milk.  2 hours before the procedure - stop drinking clear  liquids.  Medicines  Ask your health care provider about: ? Changing or stopping your regular medicines. This is especially important if you are taking diabetes medicines or blood thinners. ? Taking medicines such as aspirin and ibuprofen. These medicines can thin your blood. Do not take these medicines before your procedure if your health care provider instructs you not to. ? Taking new dietary supplements or medicines. Do not take these during the week before your procedure unless your health care provider approves them.  If you are told to take a medicine or to continue taking a medicine on the day of the procedure, take the medicine with sips of water. General instructions   Ask if you will be going home the same day, the following day, or after a longer hospital stay. ? Plan to have someone take you home. ? Plan to have someone stay with you for the first 24 hours after you leave the hospital or clinic.  For 3-6 weeks before the procedure, try not to use any tobacco products, such as cigarettes, chewing tobacco, and e-cigarettes.  You may brush your teeth on the morning of the procedure, but make sure to spit out the toothpaste. What happens during the procedure?  You will be given anesthetics through a mask and through an IV tube in one of your veins.  You may receive medicine to help you relax (sedative).  As soon as you are asleep, a breathing tube may be used to help you breathe.  An anesthesia specialist will stay with you throughout the procedure. He or she will help keep you comfortable and safe by continuing to give you medicines and adjusting the amount of medicine that you get. He or she will also watch your blood pressure, pulse, and oxygen levels to make sure that the anesthetics do not cause any problems.  If a breathing tube was used to help you breathe, it will be removed before you wake up. The procedure may vary among health care providers and hospitals. What  happens after the procedure?  You will wake up, often slowly, after the procedure is complete, usually in a recovery area.  Your blood pressure, heart rate, breathing rate, and blood oxygen level will be monitored  until the medicines you were given have worn off.  You may be given medicine to help you calm down if you feel anxious or agitated.  If you will be going home the same day, your health care provider may check to make sure you can stand, drink, and urinate.  Your health care providers will treat your pain and side effects before you go home.  Do not drive for 24 hours if you received a sedative.  You may: ? Feel nauseous and vomit. ? Have a sore throat. ? Have mental slowness. ? Feel cold or shivery. ? Feel sleepy. ? Feel tired. ? Feel sore or achy, even in parts of your body where you did not have surgery. This information is not intended to replace advice given to you by your health care provider. Make sure you discuss any questions you have with your health care provider. Document Released: 11/04/2007 Document Revised: 01/08/2016 Document Reviewed: 07/12/2015 Elsevier Interactive Patient Education  2018 [place] Anesthesia, Adult, Care After These instructions provide you with information about caring for yourself after your procedure. Your health care provider may also give you more specific instructions. Your treatment has been planned according to current medical practices, but problems sometimes occur. Call your health care provider if you have any problems or questions after your procedure. What can I expect after the procedure? After the procedure, it is common to have:  Vomiting.  A sore throat.  Mental slowness.  It is common to feel:  Nauseous.  Cold or shivery.  Sleepy.  Tired.  Sore or achy, even in parts of your body where you did not have surgery.  Follow these instructions at home: For at least 24 hours after the  procedure:  Do not: ? Participate in activities where you could fall or become injured. ? Drive. ? Use heavy machinery. ? Drink alcohol. ? Take sleeping pills or medicines that cause drowsiness. ? Make important decisions or sign legal documents. ? Take care of children on your own.  Rest. Eating and drinking  If you vomit, drink water, juice, or soup when you can drink without vomiting.  Drink enough fluid to keep your urine clear or pale yellow.  Make sure you have little or no nausea before eating solid foods.  Follow the diet recommended by your health care provider. General instructions  Have a responsible adult stay with you until you are awake and alert.  Return to your normal activities as told by your health care provider. Ask your health care provider what activities are safe for you.  Take over-the-counter and prescription medicines only as told by your health care provider.  If you smoke, do not smoke without supervision.  Keep all follow-up visits as told by your health care provider. This is important. Contact a health care provider if:  You continue to have nausea or vomiting at home, and medicines are not helpful.  You cannot drink fluids or start eating again.  You cannot urinate after 8-12 hours.  You develop a skin rash.  You have fever.  You have increasing redness at the site of your procedure. Get help right away if:  You have difficulty breathing.  You have chest pain.  You have unexpected bleeding.  You feel that you are having a life-threatening or urgent problem. This information is not intended to replace advice given to you by your health care provider. Make sure you discuss any questions you have with your health care provider. Document  Released: 11/03/2000 Document Revised: 12/31/2015 Document Reviewed: 07/12/2015 Elsevier Interactive Patient Education  [employer].

## 2017-06-01 ENCOUNTER — Encounter (HOSPITAL_COMMUNITY): Payer: Self-pay

## 2017-06-01 ENCOUNTER — Encounter (HOSPITAL_COMMUNITY)
Admission: RE | Admit: 2017-06-01 | Discharge: 2017-06-01 | Disposition: A | Discharge status: Home / Self Care | Payer: BLUE CROSS/BLUE SHIELD | Source: Ambulatory Visit | Attending: General Surgery | Admitting: General Surgery

## 2017-06-01 ENCOUNTER — Other Ambulatory Visit: Payer: Self-pay

## 2017-06-01 DIAGNOSIS — Z87891 Personal history of nicotine dependence: Secondary | ICD-10-CM | POA: Insufficient documentation

## 2017-06-01 DIAGNOSIS — I10 Essential (primary) hypertension: Secondary | ICD-10-CM | POA: Diagnosis not present

## 2017-06-01 DIAGNOSIS — Z01810 Encounter for preprocedural cardiovascular examination: Secondary | ICD-10-CM | POA: Diagnosis not present

## 2017-06-01 DIAGNOSIS — Z01812 Encounter for preprocedural laboratory examination: Secondary | ICD-10-CM | POA: Insufficient documentation

## 2017-06-01 DIAGNOSIS — R1900 Intra-abdominal and pelvic swelling, mass and lump, unspecified site: Secondary | ICD-10-CM | POA: Diagnosis not present

## 2017-06-01 DIAGNOSIS — G4730 Sleep apnea, unspecified: Secondary | ICD-10-CM | POA: Insufficient documentation

## 2017-06-01 LAB — CBC WITH DIFFERENTIAL/PLATELET
BASOS PCT: 0 %
Basophils Absolute: 0 10*3/uL (ref 0.0–0.1)
EOS ABS: 0.2 10*3/uL (ref 0.0–0.7)
Eosinophils Relative: 2 %
HEMATOCRIT: 41.2 % (ref 39.0–52.0)
HEMOGLOBIN: 14.1 g/dL (ref 13.0–17.0)
LYMPHS ABS: 2.3 10*3/uL (ref 0.7–4.0)
Lymphocytes Relative: 30 %
MCH: 31.3 pg (ref 26.0–34.0)
MCHC: 34.2 g/dL (ref 30.0–36.0)
MCV: 91.6 fL (ref 78.0–100.0)
MONOS PCT: 13 %
Monocytes Absolute: 1 10*3/uL (ref 0.1–1.0)
NEUTROS ABS: 4.2 10*3/uL (ref 1.7–7.7)
NEUTROS PCT: 55 %
Platelets: 263 10*3/uL (ref 150–400)
RBC: 4.5 MIL/uL (ref 4.22–5.81)
RDW: 12.4 % (ref 11.5–15.5)
WBC: 7.7 10*3/uL (ref 4.0–10.5)

## 2017-06-01 LAB — BASIC METABOLIC PANEL
ANION GAP: 8 (ref 5–15)
BUN: 16 mg/dL (ref 6–20)
CHLORIDE: 109 mmol/L (ref 101–111)
CO2: 25 mmol/L (ref 22–32)
CREATININE: 1.05 mg/dL (ref 0.61–1.24)
Calcium: 9.3 mg/dL (ref 8.9–10.3)
GFR calc non Af Amer: >60 mL/min (ref >60)
Glucose, Bld: 79 mg/dL (ref 65–99)
Potassium: 4 mmol/L (ref 3.5–5.1)
SODIUM: 142 mmol/L (ref 135–145)

## 2017-06-01 NOTE — H&P (Signed)
Kurt Powell; [id number]; [birth year]   HPI Patient is a 56 year old white male who was referred to my care for evaluation treatment of a subcutaneous nodule on his right flank. It is been present for some time. It seems to different size over the past few years. It is occasionally tender to touch. Only when pressure is applied. He states there may have been some growth over the past year, but he is not certain. No drainage has been noted. He currently has no pain. Past Medical History:  Diagnosis Date  . Hypertension     Past Surgical History:  Procedure Laterality Date  . COLONOSCOPY N/A 11/29/2015   Procedure: COLONOSCOPY;  Surgeon: Rogene Houston, MD;  Location: [facility];  Service: Endoscopy;  Laterality: N/A;  930  . NO PAST SURGERIES      History reviewed. No pertinent family history.  Current Outpatient Prescriptions on File Prior to Visit  Medication Sig Dispense Refill  . amLODipine-olmesartan (AZOR) 5-20 MG tablet Take 1 tablet by mouth daily.  0  . Ascorbic Acid (VITAMIN C PO) Take 1 tablet by mouth daily.    Marland Kitchen GARLIC PO Take 1 capsule by mouth daily.    . Omega-3 Fatty Acids (FISH OIL PO) Take 1 capsule by mouth daily.     No current facility-administered medications on file prior to visit.     No Known Allergies  History  Alcohol Use No    History  Smoking Status  . Former Smoker  . Quit date: 11/29/1982  Smokeless Tobacco  . Never Used    Review of Systems  Constitutional: Negative.   HENT: Negative.   Eyes: Negative.   Respiratory:       Uses CPAP machine  Cardiovascular: Negative.   Gastrointestinal: Negative.   Genitourinary: Negative.   Musculoskeletal: Negative.   Skin: Positive for rash.  Neurological: Positive for dizziness.  Endo/Heme/Allergies: Negative.   Psychiatric/Behavioral: Negative.     Objective   Vitals:   02/17/17 1001  BP: (!) 153/94  Pulse: 80  Resp: 18  Temp: 98.7 F (37.1 C)    Physical Exam   Constitutional: He is oriented to person, place, and time and well-developed, well-nourished, and in no distress.  HENT:  Head: Normocephalic and atraumatic.  Neck: Normal range of motion. Neck supple.  Cardiovascular: Normal rate, regular rhythm and normal heart sounds.   No murmur heard. Pulmonary/Chest: Effort normal and breath sounds normal. He has no wheezes. He has no rales.  Abdominal: Soft. He exhibits mass. He exhibits no distension. There is no tenderness.  6 x 8 cm ovoid subcutaneous rubbery mass present along right flank. Nontender.  Neurological: He is alert and oriented to person, place, and time.  Vitals reviewed.   Assessment  Large lipoma, right flank Plan   I told the patient that the risk of this being malignant was very small. That being said, should he have increased enlargement, he should consider removal. He would like to think about it and will call me to schedule the surgery. Literature given.

## 2017-06-05 ENCOUNTER — Ambulatory Visit (HOSPITAL_COMMUNITY)
Admission: RE | Admit: 2017-06-05 | Discharge: 2017-06-05 | Disposition: A | Discharge status: Home / Self Care | Payer: BLUE CROSS/BLUE SHIELD | Source: Ambulatory Visit | Attending: General Surgery | Admitting: General Surgery

## 2017-06-05 ENCOUNTER — Ambulatory Visit (HOSPITAL_COMMUNITY): Payer: BLUE CROSS/BLUE SHIELD | Admitting: Anesthesiology

## 2017-06-05 ENCOUNTER — Encounter (HOSPITAL_COMMUNITY)
Admission: RE | Disposition: A | Discharge status: Home / Self Care | Payer: Self-pay | Source: Ambulatory Visit | Attending: General Surgery

## 2017-06-05 ENCOUNTER — Encounter (HOSPITAL_COMMUNITY): Payer: Self-pay | Admitting: *Deleted

## 2017-06-05 DIAGNOSIS — D171 Benign lipomatous neoplasm of skin and subcutaneous tissue of trunk: Secondary | ICD-10-CM

## 2017-06-05 DIAGNOSIS — Z6837 Body mass index (BMI) 37.0-37.9, adult: Secondary | ICD-10-CM | POA: Diagnosis not present

## 2017-06-05 DIAGNOSIS — Z79899 Other long term (current) drug therapy: Secondary | ICD-10-CM | POA: Diagnosis not present

## 2017-06-05 DIAGNOSIS — M799 Soft tissue disorder, unspecified: Secondary | ICD-10-CM | POA: Diagnosis not present

## 2017-06-05 DIAGNOSIS — I10 Essential (primary) hypertension: Secondary | ICD-10-CM | POA: Insufficient documentation

## 2017-06-05 DIAGNOSIS — G4730 Sleep apnea, unspecified: Secondary | ICD-10-CM | POA: Diagnosis not present

## 2017-06-05 DIAGNOSIS — Z87891 Personal history of nicotine dependence: Secondary | ICD-10-CM | POA: Diagnosis not present

## 2017-06-05 DIAGNOSIS — R222 Localized swelling, mass and lump, trunk: Secondary | ICD-10-CM | POA: Diagnosis not present

## 2017-06-05 HISTORY — PX: MASS EXCISION: SHX2000

## 2017-06-05 SURGERY — EXCISION MASS
Anesthesia: General | Laterality: Right

## 2017-06-05 MED ORDER — PROPOFOL 10 MG/ML IV BOLUS
INTRAVENOUS | Status: AC
  Filled 2017-06-05: qty 20

## 2017-06-05 MED ORDER — ONDANSETRON 4 MG PO TBDP
4.0000 mg | ORAL_TABLET | Freq: Once | ORAL | Status: AC
  Administered 2017-06-05: 4 mg via ORAL

## 2017-06-05 MED ORDER — POVIDONE-IODINE 10 % OINT PACKET
TOPICAL_OINTMENT | CUTANEOUS | Status: DC | PRN
  Administered 2017-06-05: 1 via TOPICAL

## 2017-06-05 MED ORDER — POVIDONE-IODINE 10 % EX OINT
TOPICAL_OINTMENT | CUTANEOUS | Status: AC
  Filled 2017-06-05: qty 1

## 2017-06-05 MED ORDER — ONDANSETRON 4 MG PO TBDP
ORAL_TABLET | ORAL | Status: AC
  Filled 2017-06-05: qty 1

## 2017-06-05 MED ORDER — CHLORHEXIDINE GLUCONATE CLOTH 2 % EX PADS: 6.0000 | MEDICATED_PAD | Freq: Once | CUTANEOUS | Status: DC

## 2017-06-05 MED ORDER — ACETAMINOPHEN 325 MG PO TABS
650.0000 mg | ORAL_TABLET | Freq: Four times a day (QID) | ORAL | Status: DC | PRN
  Administered 2017-06-05: 650 mg via ORAL

## 2017-06-05 MED ORDER — 0.9 % SODIUM CHLORIDE (POUR BTL) OPTIME
TOPICAL | Status: DC | PRN
  Administered 2017-06-05: 1000 mL

## 2017-06-05 MED ORDER — BUPIVACAINE HCL (PF) 0.5 % IJ SOLN
INTRAMUSCULAR | Status: DC | PRN
Start: 2017-06-05 — End: 2017-06-05
  Administered 2017-06-05: 7 mL

## 2017-06-05 MED ORDER — MIDAZOLAM HCL 2 MG/2ML IJ SOLN
1.0000 mg | Freq: Once | INTRAMUSCULAR | Status: AC | PRN
  Administered 2017-06-05: 2 mg via INTRAVENOUS

## 2017-06-05 MED ORDER — ACETAMINOPHEN 325 MG PO TABS
ORAL_TABLET | ORAL | Status: AC
  Filled 2017-06-05: qty 2

## 2017-06-05 MED ORDER — PROPOFOL 10 MG/ML IV BOLUS
INTRAVENOUS | Status: DC | PRN
  Administered 2017-06-05: 180 mg via INTRAVENOUS

## 2017-06-05 MED ORDER — BUPIVACAINE HCL (PF) 0.5 % IJ SOLN
INTRAMUSCULAR | Status: AC
  Filled 2017-06-05: qty 30

## 2017-06-05 MED ORDER — HYDROCODONE-ACETAMINOPHEN 5-325 MG PO TABS: 1.0000 | ORAL_TABLET | ORAL | 0 refills | Status: DC | PRN

## 2017-06-05 MED ORDER — EPHEDRINE SULFATE 50 MG/ML IJ SOLN
INTRAMUSCULAR | Status: DC | PRN
  Administered 2017-06-05: 10 mg via INTRAVENOUS
  Administered 2017-06-05: 5 mg via INTRAVENOUS

## 2017-06-05 MED ORDER — LIDOCAINE HCL (PF) 1 % IJ SOLN
INTRAMUSCULAR | Status: AC
  Filled 2017-06-05: qty 5

## 2017-06-05 MED ORDER — FENTANYL CITRATE (PF) 100 MCG/2ML IJ SOLN
INTRAMUSCULAR | Status: AC
  Filled 2017-06-05: qty 2

## 2017-06-05 MED ORDER — LACTATED RINGERS IV SOLN
INTRAVENOUS | Status: DC
Start: 2017-06-05 — End: 2017-06-05
  Administered 2017-06-05: 1000 mL via INTRAVENOUS

## 2017-06-05 MED ORDER — MIDAZOLAM HCL 2 MG/2ML IJ SOLN
INTRAMUSCULAR | Status: AC
  Filled 2017-06-05: qty 2

## 2017-06-05 MED ORDER — FENTANYL CITRATE (PF) 100 MCG/2ML IJ SOLN
INTRAMUSCULAR | Status: DC | PRN
  Administered 2017-06-05: 50 ug via INTRAVENOUS

## 2017-06-05 SURGICAL SUPPLY — 42 items
ADH SKN CLS APL DERMABOND .7 (GAUZE/BANDAGES/DRESSINGS)
BAG HAMPER (MISCELLANEOUS) ×2 IMPLANT
BLADE SURG SZ11 CARB STEEL (BLADE) IMPLANT
CHLORAPREP W/TINT 10.5 ML (MISCELLANEOUS) ×2 IMPLANT
CLOTH BEACON ORANGE TIMEOUT ST (SAFETY) ×2 IMPLANT
COVER LIGHT HANDLE STERIS (MISCELLANEOUS) ×4 IMPLANT
DECANTER SPIKE VIAL GLASS SM (MISCELLANEOUS) ×2 IMPLANT
DERMABOND ADVANCED (GAUZE/BANDAGES/DRESSINGS)
DERMABOND ADVANCED .7 DNX12 (GAUZE/BANDAGES/DRESSINGS) IMPLANT
DRAPE EENT ADH APERT 31X51 STR (DRAPES) IMPLANT
ELECT NDL TIP 2.8 STRL (NEEDLE) IMPLANT
ELECT NEEDLE TIP 2.8 STRL (NEEDLE) IMPLANT
ELECT REM PT RETURN 9FT ADLT (ELECTROSURGICAL) ×2
ELECTRODE REM PT RTRN 9FT ADLT (ELECTROSURGICAL) ×1 IMPLANT
FORMALIN 10 PREFIL 120ML (MISCELLANEOUS) ×2 IMPLANT
GAUZE SPONGE 4X4 12PLY STRL (GAUZE/BANDAGES/DRESSINGS) ×3 IMPLANT
GLOVE BIOGEL PI IND STRL 7.0 (GLOVE) ×1 IMPLANT
GLOVE BIOGEL PI INDICATOR 7.0 (GLOVE) ×1
GLOVE SURG SS PI 7.5 STRL IVOR (GLOVE) ×2 IMPLANT
GOWN STRL REUS W/ TWL XL LVL3 (GOWN DISPOSABLE) ×1 IMPLANT
GOWN STRL REUS W/TWL LRG LVL3 (GOWN DISPOSABLE) ×2 IMPLANT
GOWN STRL REUS W/TWL XL LVL3 (GOWN DISPOSABLE) ×2
KIT ROOM TURNOVER APOR (KITS) ×2 IMPLANT
MANIFOLD NEPTUNE II (INSTRUMENTS) ×2 IMPLANT
NDL 22X1.5 STRL (OR ONLY) (MISCELLANEOUS) IMPLANT
NDL HYPO 25X1 1.5 SAFETY (NEEDLE) ×1 IMPLANT
NEEDLE 22X1 1/2 OR ONLY (MISCELLANEOUS) ×1
NEEDLE 22X1.5 STRL (OR ONLY) (MISCELLANEOUS) ×1 IMPLANT
NEEDLE HYPO 25X1 1.5 SAFETY (NEEDLE) ×2 IMPLANT
NS IRRIG 1000ML POUR BTL (IV SOLUTION) ×2 IMPLANT
PACK MINOR (CUSTOM PROCEDURE TRAY) ×1 IMPLANT
PAD ARMBOARD 7.5X6 YLW CONV (MISCELLANEOUS) ×2 IMPLANT
SET BASIN LINEN APH (SET/KITS/TRAYS/PACK) ×2 IMPLANT
SPONGE GAUZE 4X4 12PLY STER LF (GAUZE/BANDAGES/DRESSINGS) ×1 IMPLANT
STAPLER VISISTAT (STAPLE) ×1 IMPLANT
SUT ETHILON 3 0 FSL (SUTURE) IMPLANT
SUT PROLENE 4 0 PS 2 18 (SUTURE) IMPLANT
SUT VIC AB 3-0 SH 27 (SUTURE) ×6
SUT VIC AB 3-0 SH 27X BRD (SUTURE) IMPLANT
SUT VIC AB 4-0 PS2 27 (SUTURE) IMPLANT
SYR CONTROL 10ML LL (SYRINGE) ×2 IMPLANT
TAPE CLOTH SURG 4X10 WHT LF (GAUZE/BANDAGES/DRESSINGS) ×1 IMPLANT

## 2017-06-05 NOTE — Anesthesia Procedure Notes (Signed)
Procedure Name: LMA Insertion Date/Time: 06/05/2017 8:36 AM Performed by: Vista Deck Pre-anesthesia Checklist: Patient identified, Patient being monitored, Emergency Drugs available, Timeout performed and Suction available Patient Re-evaluated:Patient Re-evaluated prior to induction Oxygen Delivery Method: Circle System Utilized Preoxygenation: Pre-oxygenation with 100% oxygen Induction Type: IV induction Ventilation: Mask ventilation without difficulty LMA: LMA inserted LMA Size: 5.0 Number of attempts: 1 Placement Confirmation: positive ETCO2 and breath sounds checked- equal and bilateral Tube secured with: Tape Dental Injury: Teeth and Oropharynx as per pre-operative assessment

## 2017-06-05 NOTE — Discharge Instructions (Signed)
Lipoma Removal, Care After  Refer to this sheet in the next few weeks. These instructions provide you with information about caring for yourself after your procedure. Your health care provider may also give you more specific instructions. Your treatment has been planned according to current medical practices, but problems sometimes occur. Call your health care provider if you have any problems or questions after your procedure.  What can I expect after the procedure?  After the procedure, it is common to have:  · Mild pain.  · Swelling.  · Bruising.    Follow these instructions at home:    Bathing  · Do not take baths, swim, or use a hot tub until your health care provider approves. Ask your health care provider if you can take showers. You may only be allowed to take sponge baths for bathing.  · Keep your bandage (dressing) dry until your health care provider says it can be removed.  Incision care    · Follow instructions from your health care provider about how to take care of your incision. Make sure you:  ? Wash your hands with soap and water before you change your bandage (dressing). If soap and water are not available, use hand sanitizer.  ? Change your dressing as told by your health care provider.  ? Leave stitches (sutures), skin glue, or adhesive strips in place. These skin closures may need to stay in place for 2 weeks or longer. If adhesive strip edges start to loosen and curl up, you may trim the loose edges. Do not remove adhesive strips completely unless your health care provider tells you to do that.  · Check your incision area every day for signs of infection. Check for:  ? More redness, swelling, or pain.  ? Fluid or blood.  ? Warmth.  ? Pus or a bad smell.  Driving  · Do not drive or operate heavy machinery while taking prescription pain medicine.  · Do not drive for 24 hours if you received a medicine to help you relax (sedative) during your procedure.  · Ask your health care provider when it is  safe for you to drive.  General instructions  · Take over-the-counter and prescription medicines only as told by your health care provider.  · Do not use any tobacco products, such as cigarettes, chewing tobacco, and e-cigarettes. These can delay healing. If you need help quitting, ask your health care provider.  · Return to your normal activities as told by your health care provider. Ask your health care provider what activities are safe for you.  · Keep all follow-up visits as told by your health care provider. This is important.  Contact a health care provider if:  · You have more redness, swelling, or pain around your incision.  · You have fluid or blood coming from your incision.  · Your incision feels warm to the touch.  · You have pus or a bad smell coming from your incision.  · You have pain that does not get better with medicine.  Get help right away if:  · You have chills or a fever.  · You have severe pain.  This information is not intended to replace advice given to you by your health care provider. Make sure you discuss any questions you have with your health care provider.  Document Released: 10/11/2015 Document Revised: 01/08/2016 Document Reviewed: 10/11/2015  Elsevier Interactive Patient Education © 2018 [employer]

## 2017-06-05 NOTE — Anesthesia Preprocedure Evaluation (Signed)
Anesthesia Evaluation  Patient identified by MRN, date of birth, ID band Patient awake    Airway Mallampati: I  TM Distance: >3 FB Neck ROM: Full   Comment: Neck is thick and pt has a beard Dental no notable dental hx.    Pulmonary neg pulmonary ROS, sleep apnea and Continuous Positive Airway Pressure Ventilation , former smoker,    breath sounds clear to auscultation       Cardiovascular hypertension, Pt. on medications  Rhythm:Regular Rate:Normal  Normal sinus rhythm Nonspecific ST abnormality Abnormal ECG Oct 2018   Neuro/Psych    GI/Hepatic negative GI ROS, Neg liver ROS,   Endo/Other  Morbid obesity  Renal/GU negative Renal ROS  negative genitourinary   Musculoskeletal negative musculoskeletal ROS (+)   Abdominal (+) + obese,  Abdomen: soft.    Peds  Hematology negative hematology ROS (+)   Anesthesia Other Findings   Reproductive/Obstetrics                             Anesthesia Physical Anesthesia Plan  ASA: III  Anesthesia Plan: General   Post-op Pain Management:    Induction: Intravenous  PONV Risk Score and Plan:   Airway Management Planned: LMA  Additional Equipment:   Intra-op Plan:   Post-operative Plan: Extubation in OR  Informed Consent: I have reviewed the patients History and Physical, chart, labs and discussed the procedure including the risks, benefits and alternatives for the proposed anesthesia with the patient or authorized representative who has indicated his/her understanding and acceptance.   Dental advisory given  Plan Discussed with: CRNA and Surgeon  Anesthesia Plan Comments:         Anesthesia Quick Evaluation

## 2017-06-05 NOTE — Transfer of Care (Signed)
Immediate Anesthesia Transfer of Care Note  Patient: Kurt Powell  Procedure(s) Performed: EXCISION SUBCUTANEOUS MASS RIGHT FLANK 8CM (Right )  Patient Location: PACU  Anesthesia Type:General  Level of Consciousness: awake, alert  and patient cooperative  Airway & Oxygen Therapy: Patient Spontanous Breathing and Patient connected to nasal cannula oxygen  Post-op Assessment: Report given to RN and Post -op Vital signs reviewed and stable  Post vital signs: Reviewed and stable  Last Vitals:  Vitals:   06/05/17 0820 06/05/17 0825  BP: 117/76 130/86  Pulse:    Resp: 17 15  Temp:    SpO2: 94% 97%    Last Pain:  Vitals:   06/05/17 0733  TempSrc: Oral      Patients Stated Pain Goal: 5 (34/03/52 4818)  Complications: No apparent anesthesia complications

## 2017-06-05 NOTE — Op Note (Signed)
Patient:  Kurt Powell  DOB:  1961-07-10  MRN:  [id number]   Preop Diagnosis:  Soft tissue mass, right flank  Postop Diagnosis:  Same, lipoma  Procedure:  Excision of10 cm benign soft tissue and mass, right flank  Surgeon:  Aviva Signs, M.D.  Anes:  LMA  Indications:  Patient is a 56 year old white male who has notedrecent enlargement of a soft tissue mass along the right flank. The risks and benefits of the procedure including bleeding, infection, and recurrence of the mass were fully explained to the patient, who gave informed consent.  Procedure note:  The patient was placed in the left lateral decubitus position after an LMA was administered. The right flank was prepped and draped using the usual sterile technique with DuraPrep. Surgical site confirmation was performed.  The ovoid mass was noted along the inferior aspect of the right flank. A transverse incision was made over the greatest diameter. This was taken down to the subcutaneous tissue. A large lipoma was found. It was freed away from the subcutaneous tissue without difficulty. It was sent to pathology for examination. A bleeding was controlled using Bovie electrocautery. 0.5% Sensorcaine was instilled in the surrounding wound. The subcutaneous layer was reapproximated using a 3-0 Vicryl interrupted suture. The skin was closed using staples. Betadine ointment and dry sterile dressings were applied.  All tape and needle counts were correct at the end of the procedure. The patient was awakened and transferred to PACU in stable condition.  Complications:  none  EBL:  minimal  Specimen:  Lipoma, right flank

## 2017-06-05 NOTE — Anesthesia Postprocedure Evaluation (Signed)
Anesthesia Post Note  Patient: Kurt Powell  Procedure(s) Performed: EXCISION SUBCUTANEOUS MASS RIGHT FLANK 8CM (Right )  Patient location during evaluation: PACU Anesthesia Type: General Level of consciousness: awake and alert Pain management: satisfactory to patient Vital Signs Assessment: post-procedure vital signs reviewed and stable Respiratory status: spontaneous breathing Cardiovascular status: stable Postop Assessment: no apparent nausea or vomiting Anesthetic complications: no     Last Vitals:  Vitals:   06/05/17 0912 06/05/17 0927  BP: (!) 93/57 113/76  Pulse: 80 82  Resp: 16 16  Temp: 36.7 C   SpO2: 100% 98%    Last Pain:  Vitals:   06/05/17 0927  TempSrc:   PainSc: 0-No pain                 Arial Galligan

## 2017-06-05 NOTE — Interval H&P Note (Signed)
History and Physical Interval Note:  06/05/2017 8:00 AM  Kurt Powell  has presented today for surgery, with the diagnosis of subcutaneous mass right flank 8cm  The various methods of treatment have been discussed with the patient and family. After consideration of risks, benefits and other options for treatment, the patient has consented to  Procedure(s): EXCISION SUBCUTANEOUS MASS RIGHT FLANK 8CM (Right) as a surgical intervention .  The patient's history has been reviewed, patient examined, no change in status, stable for surgery.  I have reviewed the patient's chart and labs.  Questions were answered to the patient's satisfaction.     Aviva Signs

## 2017-06-08 ENCOUNTER — Encounter (HOSPITAL_COMMUNITY): Payer: Self-pay | Admitting: General Surgery

## 2017-06-18 ENCOUNTER — Ambulatory Visit (INDEPENDENT_AMBULATORY_CARE_PROVIDER_SITE_OTHER): Payer: BLUE CROSS/BLUE SHIELD | Admitting: General Surgery

## 2017-06-18 ENCOUNTER — Encounter: Payer: Self-pay | Admitting: General Surgery

## 2017-06-18 VITALS — BP 121/76 | HR 75 | Temp 98.2°F | Resp 18 | Ht 67.0 in | Wt 236.0 lb

## 2017-06-18 DIAGNOSIS — Z09 Encounter for follow-up examination after completed treatment for conditions other than malignant neoplasm: Secondary | ICD-10-CM

## 2017-06-18 NOTE — Progress Notes (Signed)
Subjective:     Kurt Powell  Status post excision of right flank lipoma.  Doing well.  No complaints. Objective:    BP 121/76   Pulse 75   Temp 98.2 F (36.8 C)   Resp 18   Ht 5\' 7"  (1.702 m)   Wt 236 lb (107 kg)   BMI 36.96 kg/m   General:  alert, cooperative and no distress  Incision healing well.  Staples removed. Final pathology negative for malignancy.  Patient aware.     Assessment:    Doing well postoperatively.    Plan:   Resume normal activity.  Follow-up here as needed.

## 2017-10-20 ENCOUNTER — Ambulatory Visit (INDEPENDENT_AMBULATORY_CARE_PROVIDER_SITE_OTHER): Payer: BLUE CROSS/BLUE SHIELD | Admitting: Urology

## 2017-10-20 DIAGNOSIS — R972 Elevated prostate specific antigen [PSA]: Secondary | ICD-10-CM | POA: Diagnosis not present

## 2018-01-19 DIAGNOSIS — N400 Enlarged prostate without lower urinary tract symptoms: Secondary | ICD-10-CM | POA: Diagnosis not present

## 2018-01-19 DIAGNOSIS — Z6834 Body mass index (BMI) 34.0-34.9, adult: Secondary | ICD-10-CM | POA: Diagnosis not present

## 2018-01-19 DIAGNOSIS — Z0001 Encounter for general adult medical examination with abnormal findings: Secondary | ICD-10-CM | POA: Diagnosis not present

## 2018-01-19 DIAGNOSIS — I10 Essential (primary) hypertension: Secondary | ICD-10-CM | POA: Diagnosis not present

## 2018-01-19 DIAGNOSIS — Z1389 Encounter for screening for other disorder: Secondary | ICD-10-CM | POA: Diagnosis not present

## 2018-01-19 DIAGNOSIS — G4730 Sleep apnea, unspecified: Secondary | ICD-10-CM | POA: Diagnosis not present

## 2018-01-19 DIAGNOSIS — E6609 Other obesity due to excess calories: Secondary | ICD-10-CM | POA: Diagnosis not present

## 2018-10-07 DIAGNOSIS — E6609 Other obesity due to excess calories: Secondary | ICD-10-CM | POA: Diagnosis not present

## 2018-10-07 DIAGNOSIS — J111 Influenza due to unidentified influenza virus with other respiratory manifestations: Secondary | ICD-10-CM | POA: Diagnosis not present

## 2018-10-07 DIAGNOSIS — Z1389 Encounter for screening for other disorder: Secondary | ICD-10-CM | POA: Diagnosis not present

## 2018-10-07 DIAGNOSIS — Z6834 Body mass index (BMI) 34.0-34.9, adult: Secondary | ICD-10-CM | POA: Diagnosis not present

## 2018-10-07 DIAGNOSIS — B349 Viral infection, unspecified: Secondary | ICD-10-CM | POA: Diagnosis not present

## 2018-11-15 DIAGNOSIS — R972 Elevated prostate specific antigen [PSA]: Secondary | ICD-10-CM | POA: Diagnosis not present

## 2018-12-08 DIAGNOSIS — R109 Unspecified abdominal pain: Secondary | ICD-10-CM | POA: Diagnosis not present

## 2018-12-08 DIAGNOSIS — E6609 Other obesity due to excess calories: Secondary | ICD-10-CM | POA: Diagnosis not present

## 2018-12-08 DIAGNOSIS — Z6834 Body mass index (BMI) 34.0-34.9, adult: Secondary | ICD-10-CM | POA: Diagnosis not present

## 2018-12-08 DIAGNOSIS — Z1389 Encounter for screening for other disorder: Secondary | ICD-10-CM | POA: Diagnosis not present

## 2019-01-04 DIAGNOSIS — R7309 Other abnormal glucose: Secondary | ICD-10-CM | POA: Diagnosis not present

## 2019-01-04 DIAGNOSIS — Z6833 Body mass index (BMI) 33.0-33.9, adult: Secondary | ICD-10-CM | POA: Diagnosis not present

## 2019-01-04 DIAGNOSIS — Z1389 Encounter for screening for other disorder: Secondary | ICD-10-CM | POA: Diagnosis not present

## 2019-01-04 DIAGNOSIS — R109 Unspecified abdominal pain: Secondary | ICD-10-CM | POA: Diagnosis not present

## 2019-01-12 IMAGING — US US SCROTUM W/ DOPPLER COMPLETE
1 series · 14 of 25 positions shown · non-contrast
Comparison: None.

CLINICAL DATA: Spermatocele of the epididymis on physical exam.

EXAM:
SCROTAL ULTRASOUND
DOPPLER ULTRASOUND OF THE TESTICLES
TECHNIQUE: Complete ultrasound examination of the testicles, epididymis, and
other scrotal structures was performed. Color and spectral Doppler
ultrasound were also utilized to evaluate blood flow to the
testicles.

[Series 1: us scrotum w/ doppler complete · 0.06mm/px · 14 of 64 slices shown]
[im 1/64]
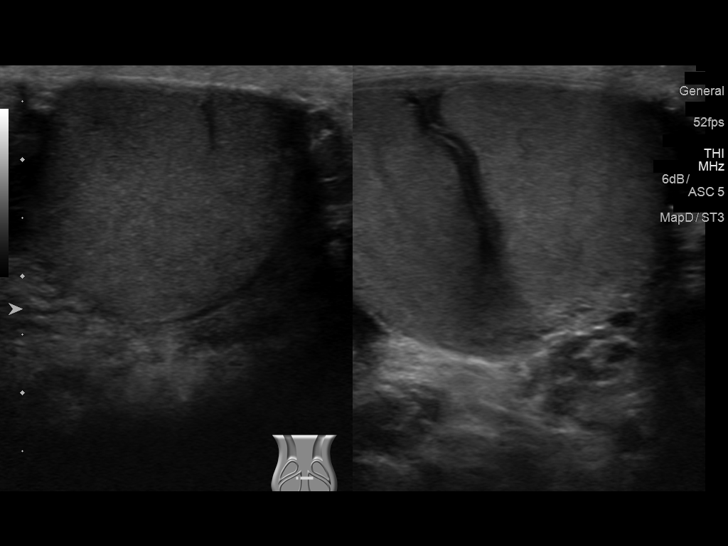
[im 6/64]
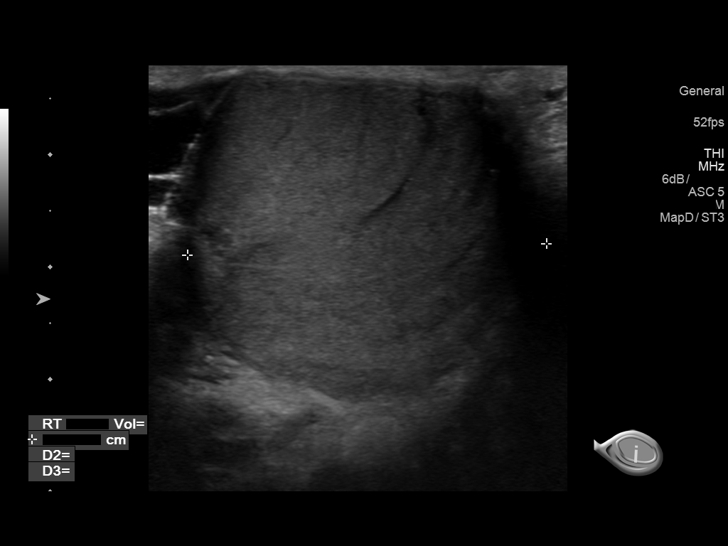
[im 11/64]
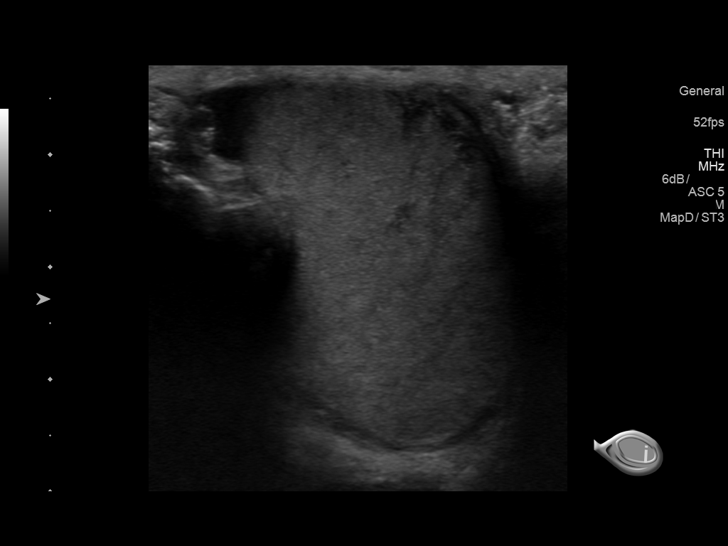
[im 16/64]
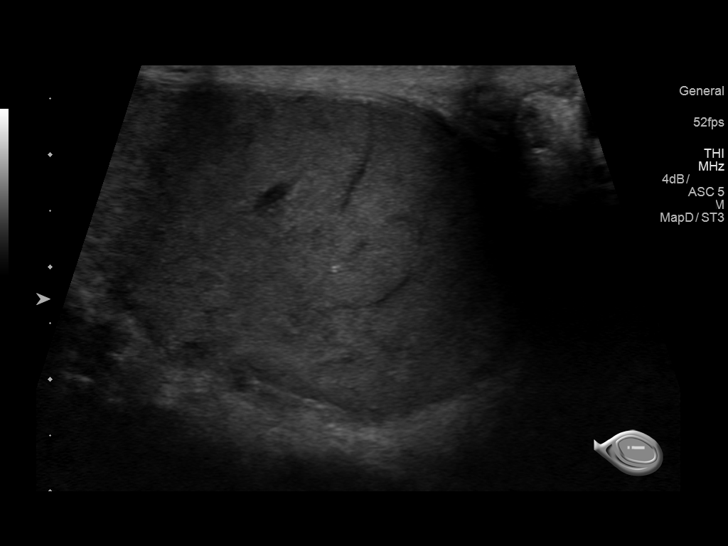
[im 22/64]
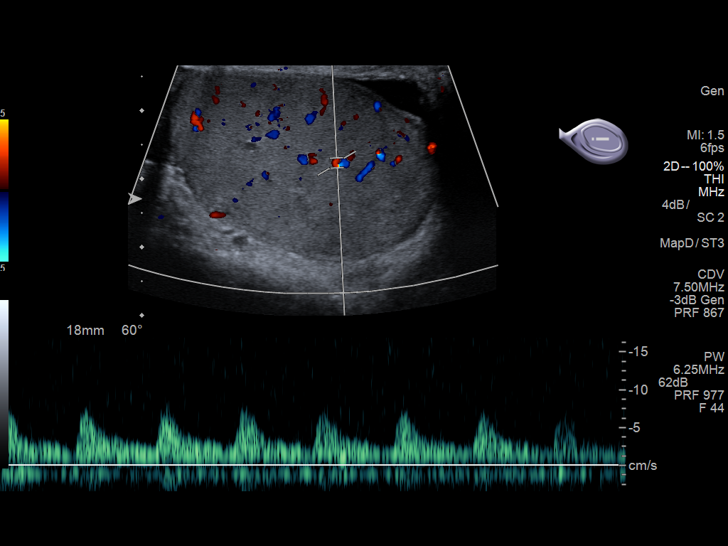
[im 24/64]
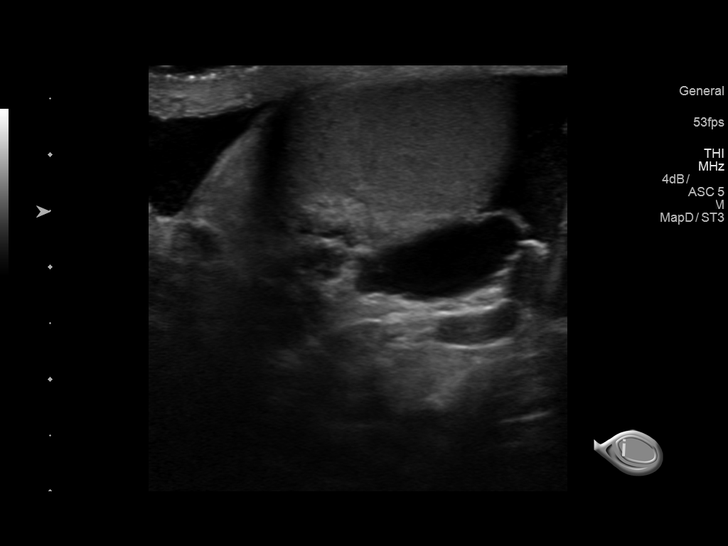
[im 29/64]
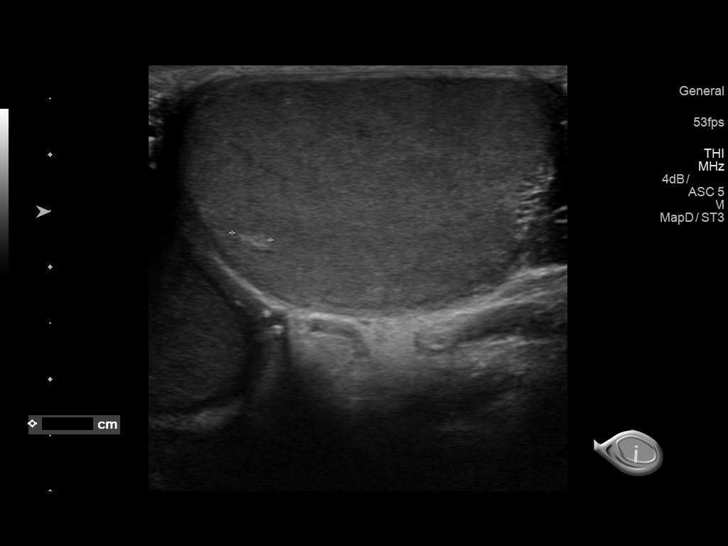
[im 35/64]
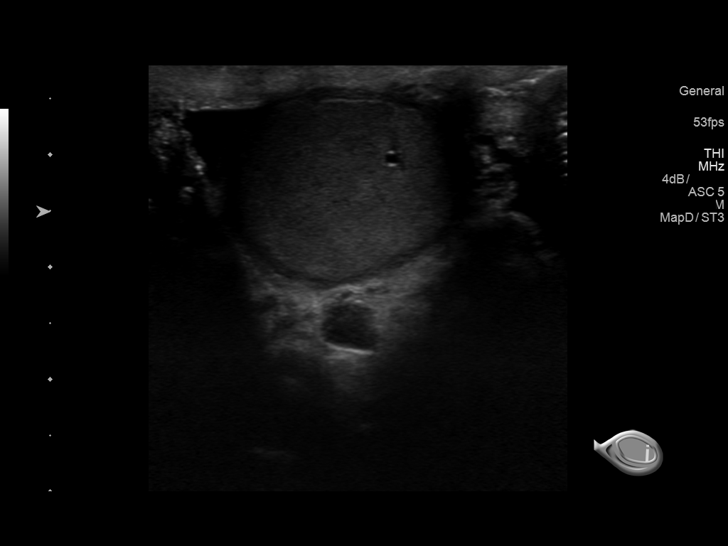
[im 40/64]
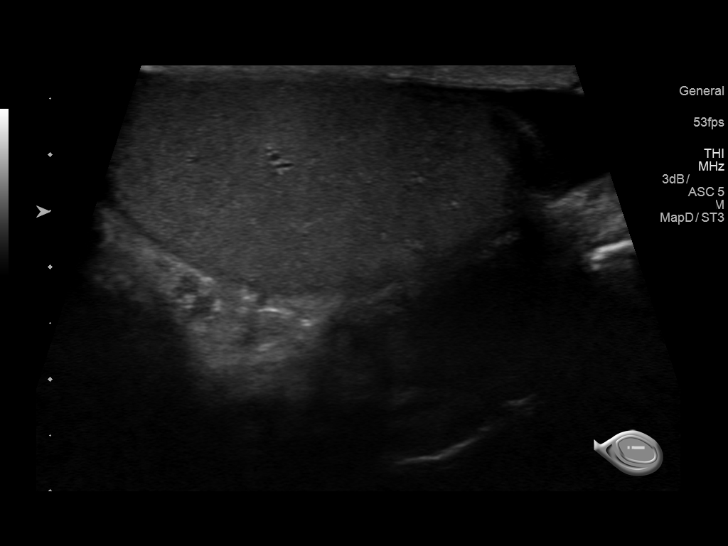
[im 43/64]
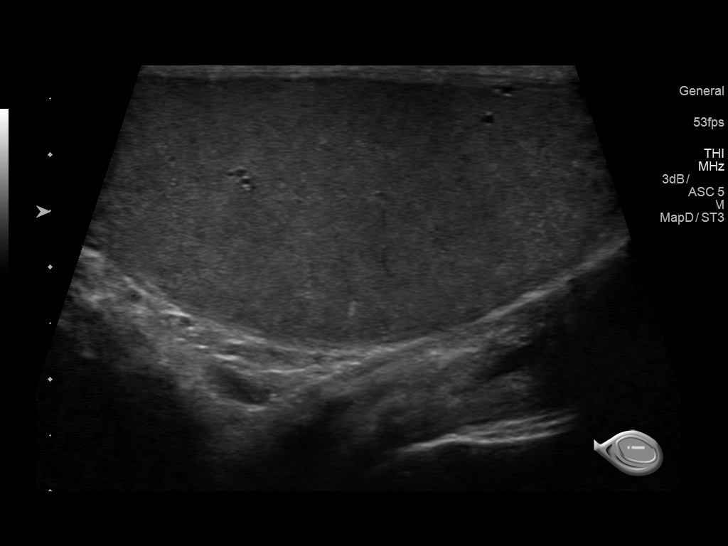
[im 48/64]
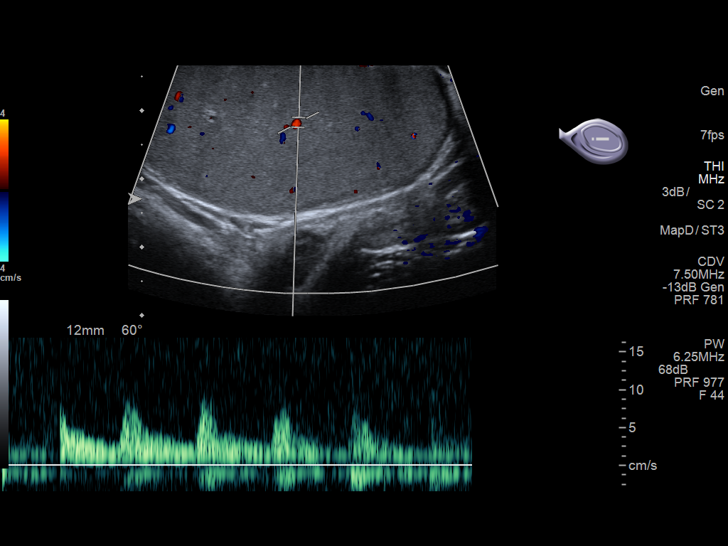
[im 53/64]
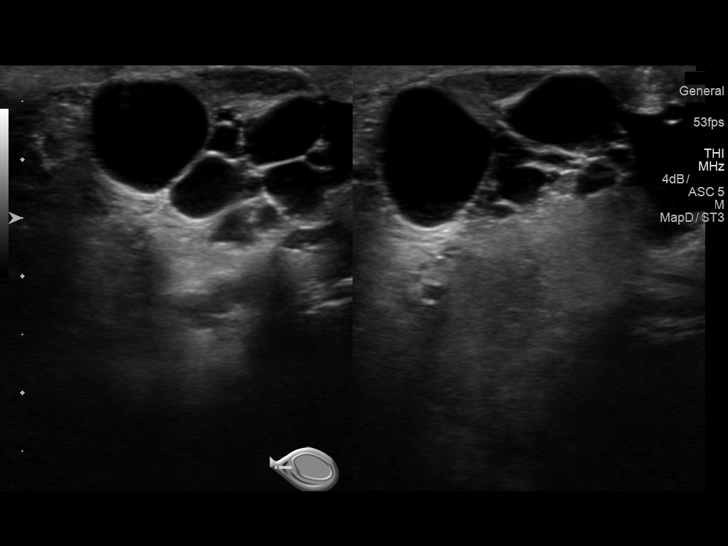
[im 58/64]
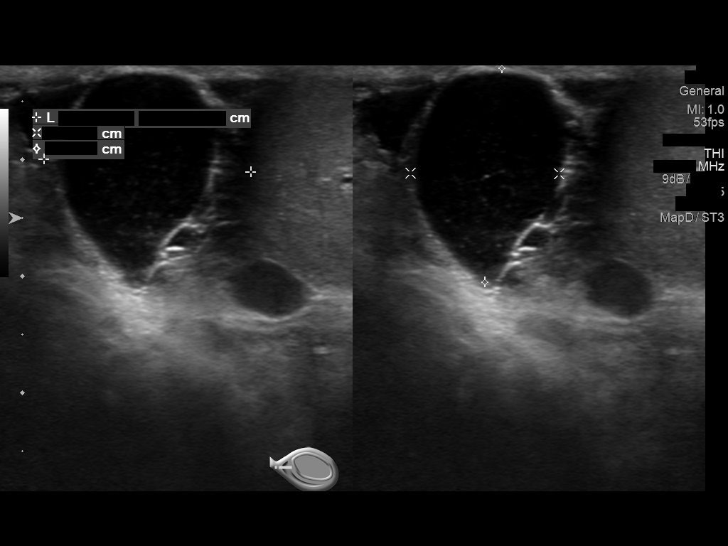
[im 64/64]
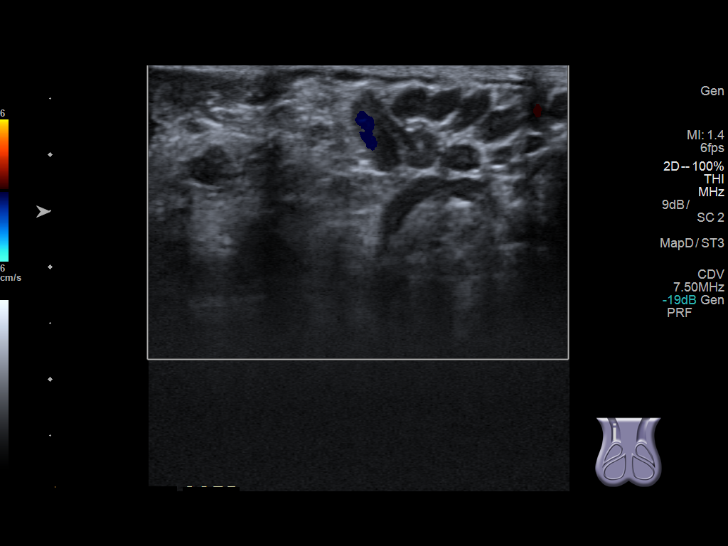

[14 of 25 positions shown; findings below may reference images not displayed]

FINDINGS: Right testicle

Measurements: 4.3 x 2.7 x 3.2 cm. No mass visualized. Few
calcifications are identified within the right testicle.

Left testicle

Measurements: 4.8 x 2.5 x 2.8 cm. No mass visualized. Few
calcifications are identified within the left testicle.

Right epididymis: Multiple simple cysts are identified in the right
epididymis.

Left epididymis: Multiple simple cysts are identified within the
left epididymis.

Hydrocele:  Small bilateral hydroceles are identified.

Varicocele:  None visualized.

Pulsed Doppler interrogation of both testes demonstrates normal low
resistance arterial and venous waveforms bilaterally.
IMPRESSION: Multiple simple cysts are identified in bilateral epididymis.

Small bilateral hydroceles.

Calcifications are identified in bilateral testis.

## 2019-02-15 ENCOUNTER — Ambulatory Visit (INDEPENDENT_AMBULATORY_CARE_PROVIDER_SITE_OTHER): Payer: BC Managed Care – PPO | Admitting: Urology

## 2019-02-15 DIAGNOSIS — R972 Elevated prostate specific antigen [PSA]: Secondary | ICD-10-CM

## 2019-03-23 DIAGNOSIS — R7309 Other abnormal glucose: Secondary | ICD-10-CM | POA: Diagnosis not present

## 2019-03-23 DIAGNOSIS — Z6833 Body mass index (BMI) 33.0-33.9, adult: Secondary | ICD-10-CM | POA: Diagnosis not present

## 2019-03-23 DIAGNOSIS — Z0001 Encounter for general adult medical examination with abnormal findings: Secondary | ICD-10-CM | POA: Diagnosis not present

## 2019-03-23 DIAGNOSIS — E748 Other specified disorders of carbohydrate metabolism: Secondary | ICD-10-CM | POA: Diagnosis not present

## 2019-03-23 DIAGNOSIS — N400 Enlarged prostate without lower urinary tract symptoms: Secondary | ICD-10-CM | POA: Diagnosis not present

## 2019-03-23 DIAGNOSIS — M549 Dorsalgia, unspecified: Secondary | ICD-10-CM | POA: Diagnosis not present

## 2019-03-23 DIAGNOSIS — I10 Essential (primary) hypertension: Secondary | ICD-10-CM | POA: Diagnosis not present

## 2019-03-23 DIAGNOSIS — E6609 Other obesity due to excess calories: Secondary | ICD-10-CM | POA: Diagnosis not present

## 2019-03-31 ENCOUNTER — Other Ambulatory Visit: Payer: Self-pay | Admitting: Internal Medicine

## 2019-03-31 ENCOUNTER — Other Ambulatory Visit (HOSPITAL_COMMUNITY): Payer: Self-pay | Admitting: Internal Medicine

## 2019-03-31 DIAGNOSIS — M549 Dorsalgia, unspecified: Secondary | ICD-10-CM

## 2019-03-31 DIAGNOSIS — R109 Unspecified abdominal pain: Secondary | ICD-10-CM

## 2019-04-13 ENCOUNTER — Ambulatory Visit (HOSPITAL_COMMUNITY)
Admission: RE | Admit: 2019-04-13 | Discharge: 2019-04-13 | Disposition: A | Discharge status: Home / Self Care | Payer: BC Managed Care – PPO | Source: Ambulatory Visit | Attending: Internal Medicine | Admitting: Internal Medicine

## 2019-04-13 ENCOUNTER — Other Ambulatory Visit: Payer: Self-pay

## 2019-04-13 DIAGNOSIS — R109 Unspecified abdominal pain: Secondary | ICD-10-CM | POA: Diagnosis not present

## 2019-04-13 DIAGNOSIS — K5730 Diverticulosis of large intestine without perforation or abscess without bleeding: Secondary | ICD-10-CM | POA: Diagnosis not present

## 2019-04-13 DIAGNOSIS — M549 Dorsalgia, unspecified: Secondary | ICD-10-CM | POA: Diagnosis not present

## 2019-04-13 LAB — POCT I-STAT CREATININE: Creatinine, Ser: 1.2 mg/dL (ref 0.61–1.24)

## 2019-04-13 MED ORDER — IOHEXOL 300 MG/ML  SOLN
100.0000 mL | Freq: Once | INTRAMUSCULAR | Status: AC | PRN
Start: 2019-04-13 — End: 2019-04-13
  Administered 2019-04-13: 100 mL via INTRAVENOUS

## 2019-09-09 DIAGNOSIS — I129 Hypertensive chronic kidney disease with stage 1 through stage 4 chronic kidney disease, or unspecified chronic kidney disease: Secondary | ICD-10-CM | POA: Diagnosis not present

## 2019-09-09 DIAGNOSIS — E6609 Other obesity due to excess calories: Secondary | ICD-10-CM | POA: Diagnosis not present

## 2019-09-09 DIAGNOSIS — R809 Proteinuria, unspecified: Secondary | ICD-10-CM | POA: Diagnosis not present

## 2019-09-09 DIAGNOSIS — N1831 Chronic kidney disease, stage 3a: Secondary | ICD-10-CM | POA: Diagnosis not present

## 2019-09-13 ENCOUNTER — Other Ambulatory Visit (HOSPITAL_COMMUNITY): Payer: Self-pay | Admitting: Nephrology

## 2019-09-13 ENCOUNTER — Other Ambulatory Visit: Payer: Self-pay | Admitting: Nephrology

## 2019-09-13 DIAGNOSIS — N1831 Chronic kidney disease, stage 3a: Secondary | ICD-10-CM

## 2019-09-23 ENCOUNTER — Ambulatory Visit (HOSPITAL_COMMUNITY)
Admission: RE | Admit: 2019-09-23 | Discharge: 2019-09-23 | Disposition: A | Discharge status: Home / Self Care | Payer: BC Managed Care – PPO | Source: Ambulatory Visit | Attending: Nephrology | Admitting: Nephrology

## 2019-09-23 ENCOUNTER — Other Ambulatory Visit: Payer: Self-pay

## 2019-09-23 DIAGNOSIS — Z79899 Other long term (current) drug therapy: Secondary | ICD-10-CM | POA: Diagnosis not present

## 2019-09-23 DIAGNOSIS — N1831 Chronic kidney disease, stage 3a: Secondary | ICD-10-CM

## 2019-09-23 DIAGNOSIS — N183 Chronic kidney disease, stage 3 unspecified: Secondary | ICD-10-CM | POA: Diagnosis not present

## 2019-09-23 DIAGNOSIS — N281 Cyst of kidney, acquired: Secondary | ICD-10-CM | POA: Diagnosis not present

## 2019-09-23 DIAGNOSIS — R809 Proteinuria, unspecified: Secondary | ICD-10-CM | POA: Diagnosis not present

## 2019-09-23 DIAGNOSIS — I129 Hypertensive chronic kidney disease with stage 1 through stage 4 chronic kidney disease, or unspecified chronic kidney disease: Secondary | ICD-10-CM | POA: Diagnosis not present

## 2019-09-23 DIAGNOSIS — Z1159 Encounter for screening for other viral diseases: Secondary | ICD-10-CM | POA: Diagnosis not present

## 2019-09-23 LAB — HM HIV SCREENING LAB: HM HIV Screening: NEGATIVE

## 2019-10-02 LAB — HM HEPATITIS C SCREENING LAB: HM Hepatitis Screen: NEGATIVE

## 2019-10-28 DIAGNOSIS — N182 Chronic kidney disease, stage 2 (mild): Secondary | ICD-10-CM | POA: Diagnosis not present

## 2019-10-28 DIAGNOSIS — I129 Hypertensive chronic kidney disease with stage 1 through stage 4 chronic kidney disease, or unspecified chronic kidney disease: Secondary | ICD-10-CM | POA: Diagnosis not present

## 2019-10-28 DIAGNOSIS — E559 Vitamin D deficiency, unspecified: Secondary | ICD-10-CM | POA: Diagnosis not present

## 2019-10-28 DIAGNOSIS — E6609 Other obesity due to excess calories: Secondary | ICD-10-CM | POA: Diagnosis not present

## 2020-03-23 DIAGNOSIS — E6609 Other obesity due to excess calories: Secondary | ICD-10-CM | POA: Diagnosis not present

## 2020-03-23 DIAGNOSIS — Z1389 Encounter for screening for other disorder: Secondary | ICD-10-CM | POA: Diagnosis not present

## 2020-03-23 DIAGNOSIS — G4730 Sleep apnea, unspecified: Secondary | ICD-10-CM | POA: Diagnosis not present

## 2020-03-23 DIAGNOSIS — Z6833 Body mass index (BMI) 33.0-33.9, adult: Secondary | ICD-10-CM | POA: Diagnosis not present

## 2020-03-23 DIAGNOSIS — R7309 Other abnormal glucose: Secondary | ICD-10-CM | POA: Diagnosis not present

## 2020-03-23 DIAGNOSIS — K219 Gastro-esophageal reflux disease without esophagitis: Secondary | ICD-10-CM | POA: Diagnosis not present

## 2020-03-23 DIAGNOSIS — Z0001 Encounter for general adult medical examination with abnormal findings: Secondary | ICD-10-CM | POA: Diagnosis not present

## 2020-03-23 DIAGNOSIS — E7489 Other specified disorders of carbohydrate metabolism: Secondary | ICD-10-CM | POA: Diagnosis not present

## 2020-03-23 DIAGNOSIS — N400 Enlarged prostate without lower urinary tract symptoms: Secondary | ICD-10-CM | POA: Diagnosis not present

## 2020-03-23 DIAGNOSIS — I10 Essential (primary) hypertension: Secondary | ICD-10-CM | POA: Diagnosis not present

## 2020-03-23 DIAGNOSIS — E669 Obesity, unspecified: Secondary | ICD-10-CM | POA: Diagnosis not present

## 2020-04-10 IMAGING — CT CT ABD-PELV W/ CM
2 of 5 series · 16 of 46 positions shown, 18 images · IV contrast (omnipaque)
Comparison: None.

CLINICAL DATA: Right-sided abdominal pain

EXAM:
CT ABDOMEN AND PELVIS WITH CONTRAST
TECHNIQUE: Multidetector CT imaging of the abdomen and pelvis was performed
using the standard protocol following bolus administration of
intravenous contrast.
CONTRAST:  100mL OMNIPAQUE IOHEXOL 300 MG/ML SOLN, additional oral
enteric contrast

[Series 2: axial st · axial · 0.89mm/px · z∈[+898,+1324]mm · 13 of 101 slices shown, 15 images]
[im 8/101  soft-tissue]
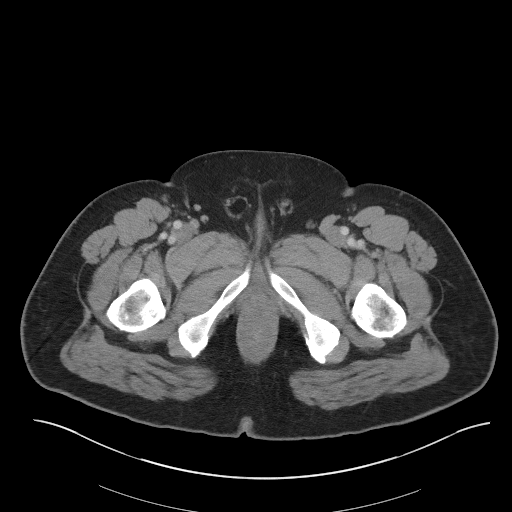
[im 8/101  bone]
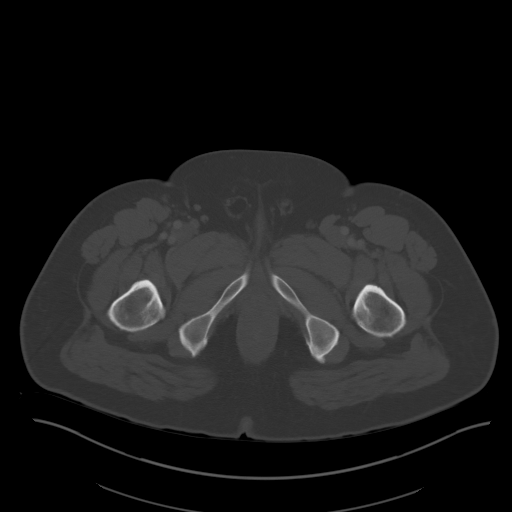
[im 15/101  soft-tissue]
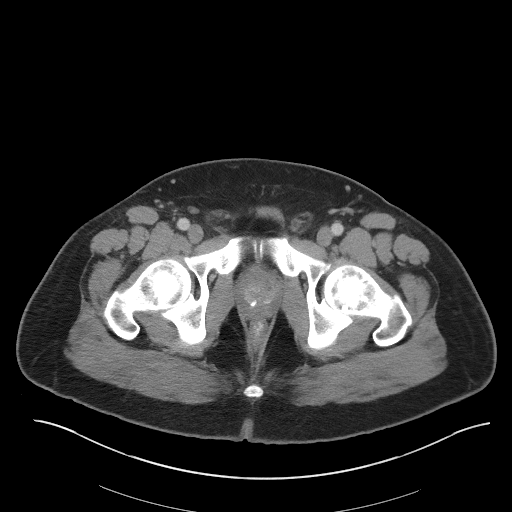
[im 22/101  soft-tissue]
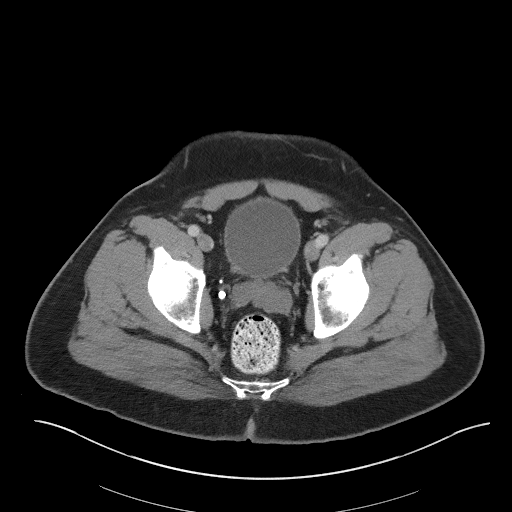
[im 29/101  soft-tissue]
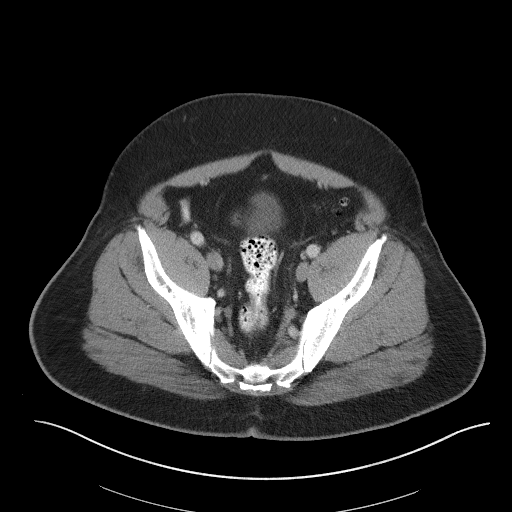
[im 36/101  soft-tissue]
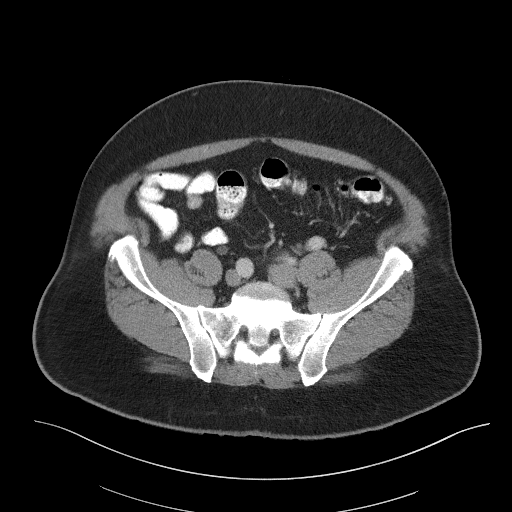
[im 43/101  soft-tissue]
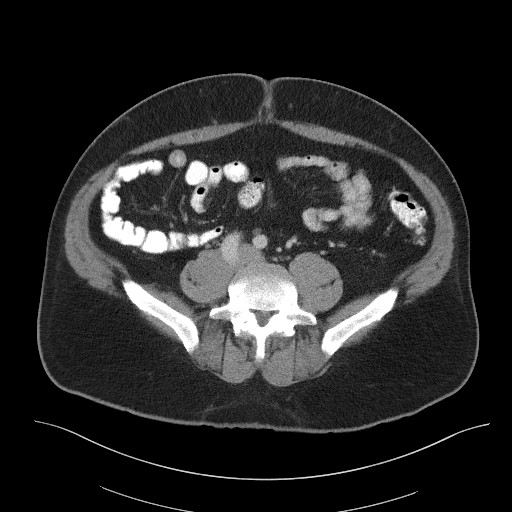
[im 51/101  soft-tissue]
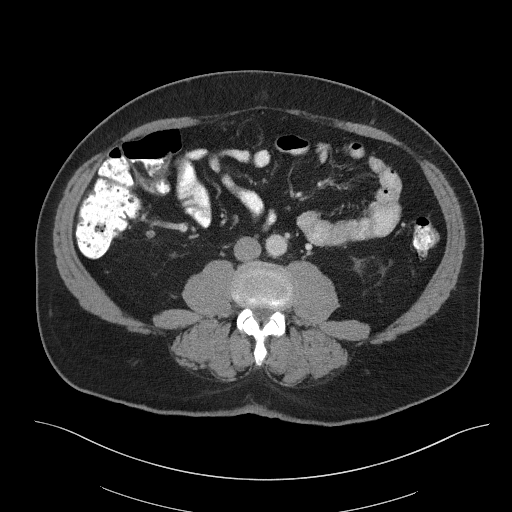
[im 58/101  soft-tissue]
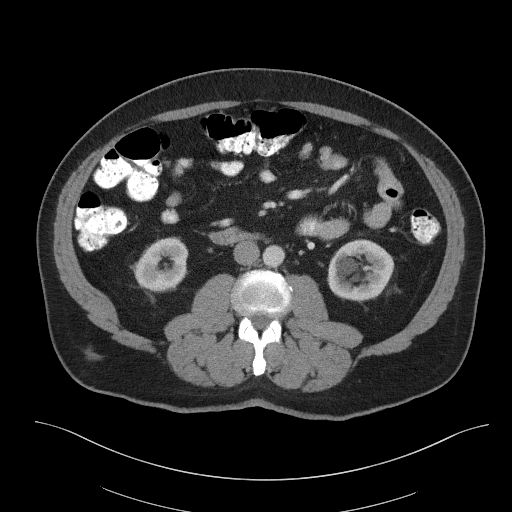
[im 65/101  soft-tissue]
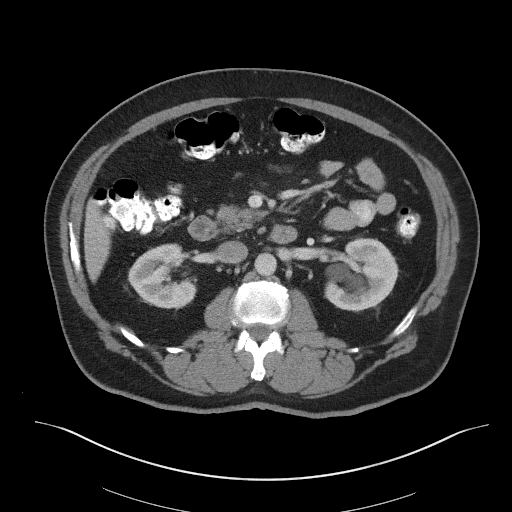
[im 65/101  bone]
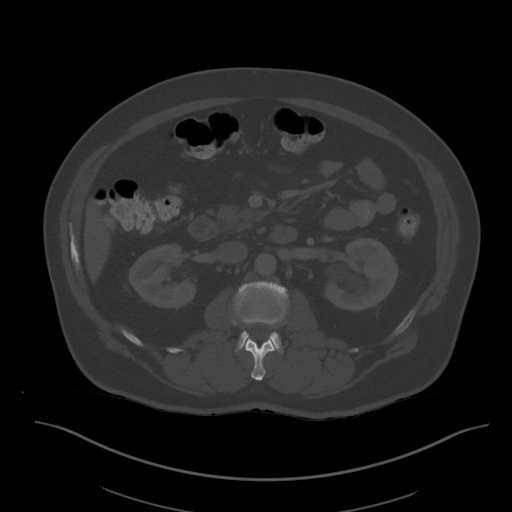
[im 72/101  soft-tissue]
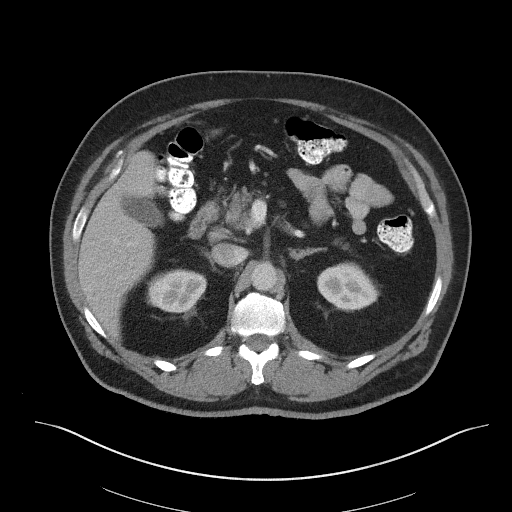
[im 79/101  soft-tissue]
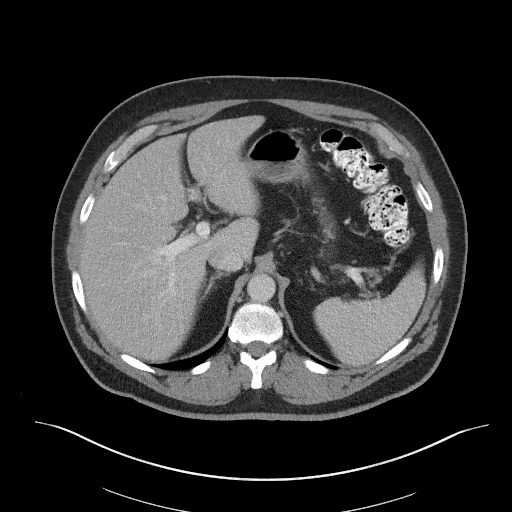
[im 86/101  soft-tissue]
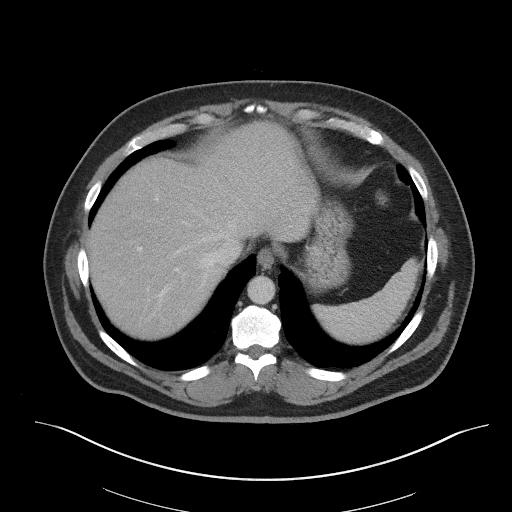
[im 93/101  soft-tissue]
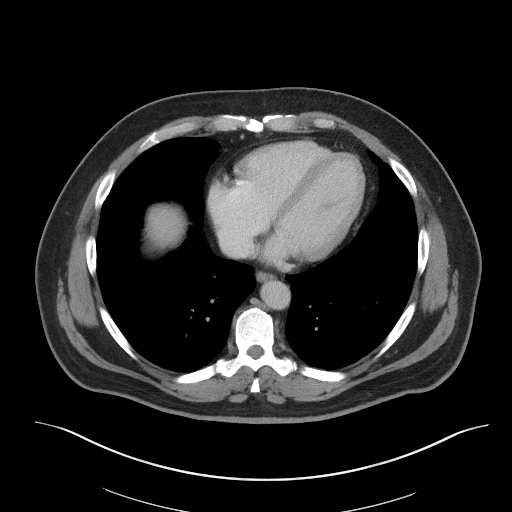

[Series 6: coronal st · coronal · 0.88mm/px · 3 of 117 slices shown]
[im 39/117  soft-tissue]
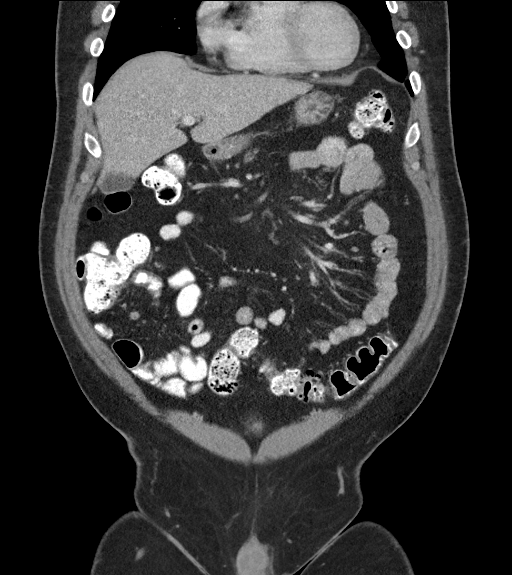
[im 52/117  soft-tissue]
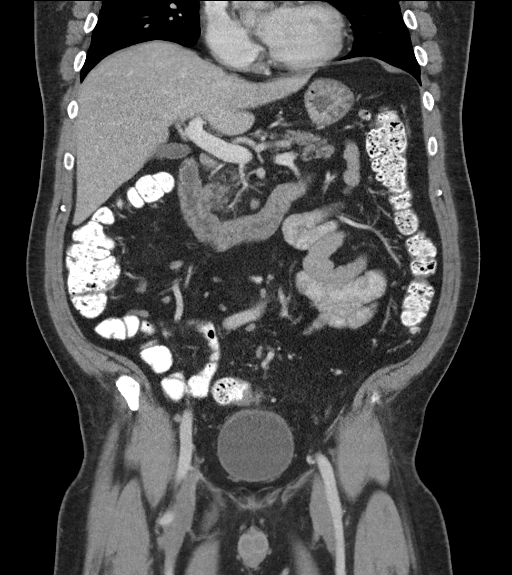
[im 65/117  soft-tissue]
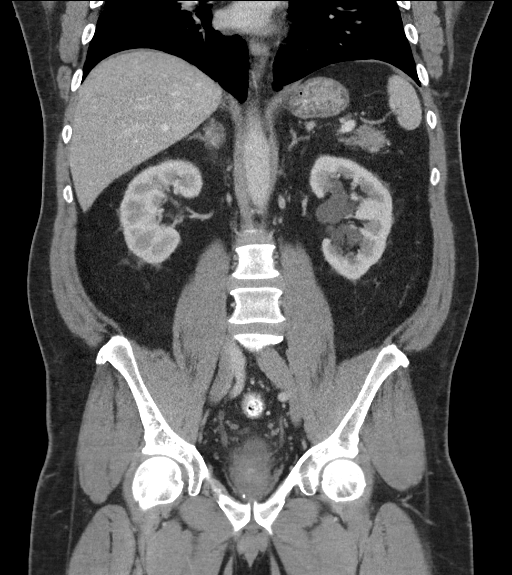

[16 of 46 positions shown; findings below may reference images not displayed]

FINDINGS: Lower chest: No acute abnormality.

Hepatobiliary: No solid liver abnormality is seen. No gallstones,
gallbladder wall thickening, or biliary dilatation.

Pancreas: Unremarkable. No pancreatic ductal dilatation or
surrounding inflammatory changes.

Spleen: Normal in size without significant abnormality.

Adrenals/Urinary Tract: Adrenal glands are unremarkable. Left-sided
parapelvic cysts. Kidneys are otherwise normal, without renal
calculi, solid lesion, or hydronephrosis. Bladder is unremarkable.

Stomach/Bowel: Stomach is within normal limits. Appendix appears
normal. No evidence of bowel wall thickening, distention, or
inflammatory changes. Sigmoid diverticulosis.

Vascular/Lymphatic: No significant vascular findings are present. No
enlarged abdominal or pelvic lymph nodes.

Reproductive: Prostatomegaly.

Other: No abdominal wall hernia or abnormality. No abdominopelvic
ascites.

Musculoskeletal: No acute or significant osseous findings.
IMPRESSION: 1. No CT findings of the abdomen or pelvis to explain right-sided
abdominal pain.

2.  Prostatomegaly.

3.  Sigmoid diverticulosis without evidence of acute diverticulitis.

## 2020-05-11 DIAGNOSIS — Z79899 Other long term (current) drug therapy: Secondary | ICD-10-CM | POA: Diagnosis not present

## 2020-05-11 DIAGNOSIS — I129 Hypertensive chronic kidney disease with stage 1 through stage 4 chronic kidney disease, or unspecified chronic kidney disease: Secondary | ICD-10-CM | POA: Diagnosis not present

## 2020-05-11 DIAGNOSIS — R809 Proteinuria, unspecified: Secondary | ICD-10-CM | POA: Diagnosis not present

## 2020-05-11 DIAGNOSIS — N182 Chronic kidney disease, stage 2 (mild): Secondary | ICD-10-CM | POA: Diagnosis not present

## 2020-05-17 DIAGNOSIS — E211 Secondary hyperparathyroidism, not elsewhere classified: Secondary | ICD-10-CM | POA: Diagnosis not present

## 2020-05-17 DIAGNOSIS — R7303 Prediabetes: Secondary | ICD-10-CM | POA: Diagnosis not present

## 2020-05-17 DIAGNOSIS — I129 Hypertensive chronic kidney disease with stage 1 through stage 4 chronic kidney disease, or unspecified chronic kidney disease: Secondary | ICD-10-CM | POA: Diagnosis not present

## 2020-05-17 DIAGNOSIS — N182 Chronic kidney disease, stage 2 (mild): Secondary | ICD-10-CM | POA: Diagnosis not present

## 2020-09-20 IMAGING — US US RENAL
1 series · 14 of 25 positions shown · non-contrast
Comparison: Prior CT from 04/13/2019.

CLINICAL DATA: Initial evaluation for chronic kidney disease, stage
III. History of hypertension, smoking.

EXAM:
RENAL / URINARY TRACT ULTRASOUND COMPLETE

[Series 1: us renal · 14 of 74 slices shown]
[im 1/74]
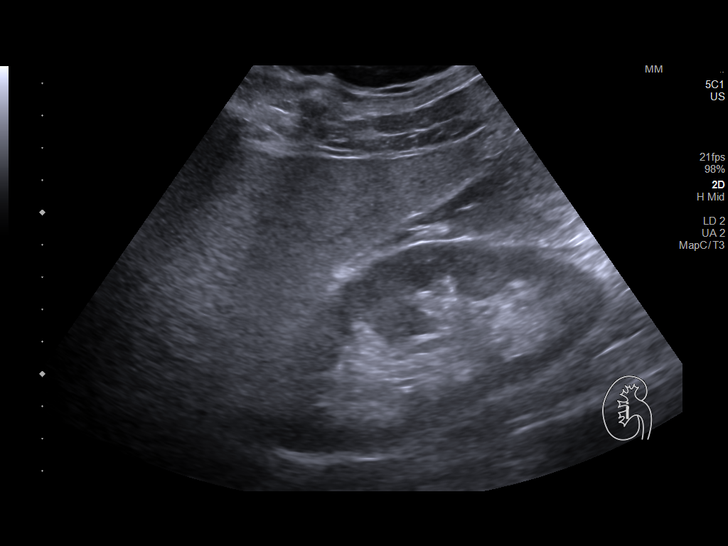
[im 7/74]
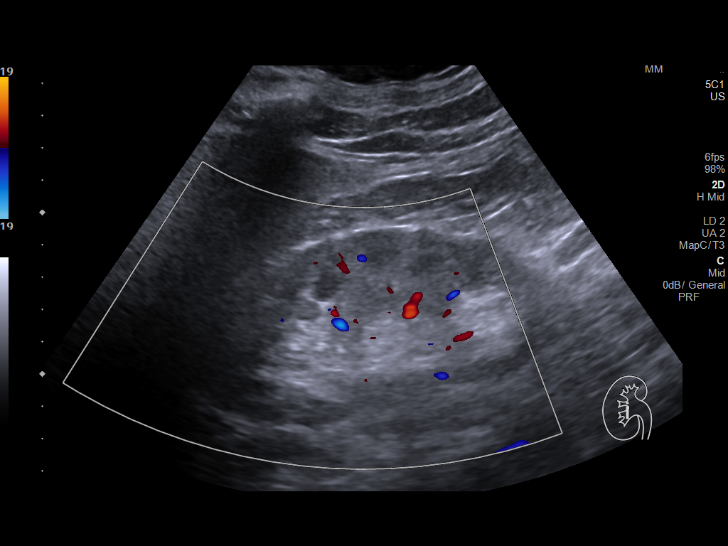
[im 13/74]
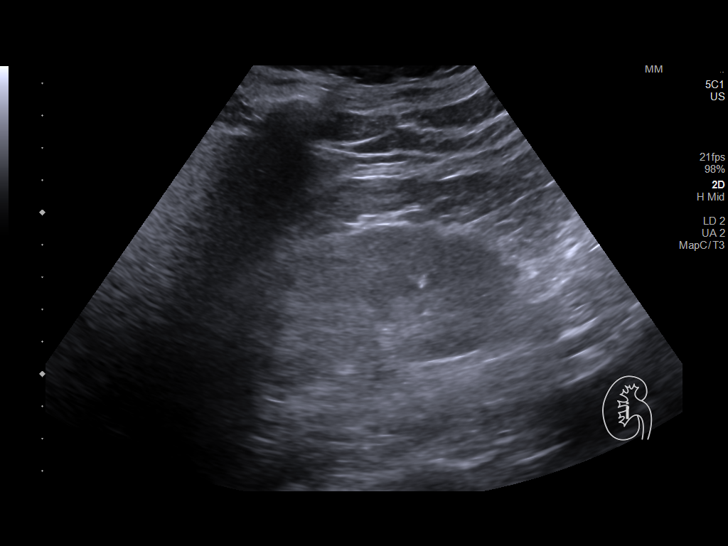
[im 19/74]
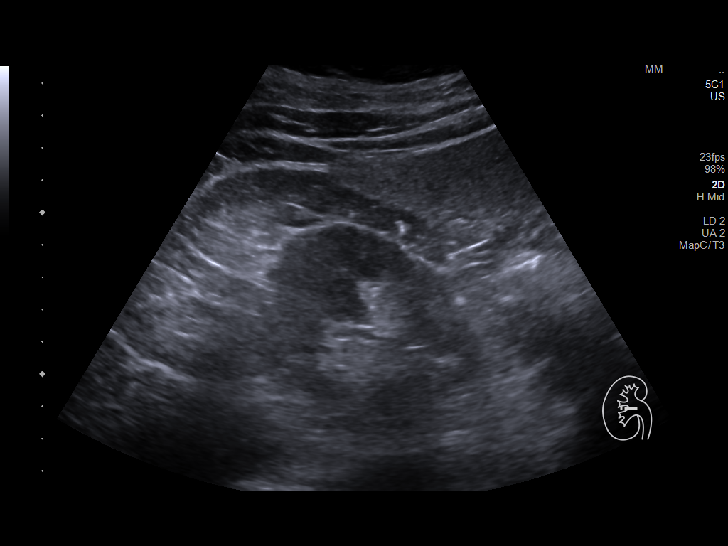
[im 25/74]
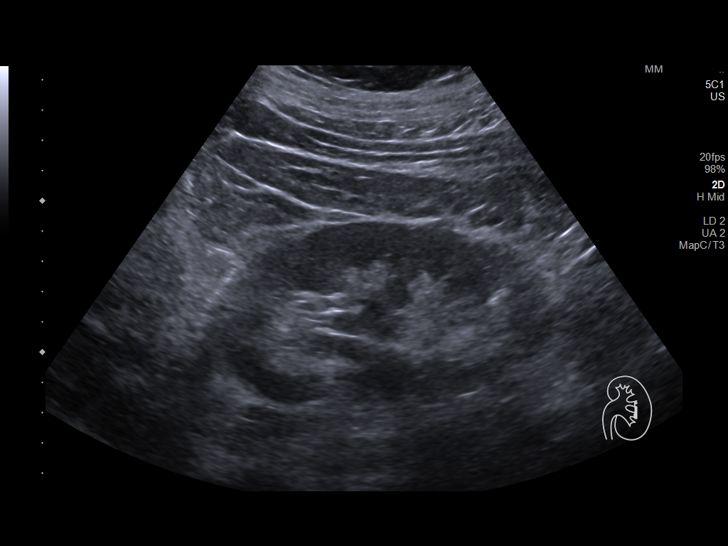
[im 28/74]
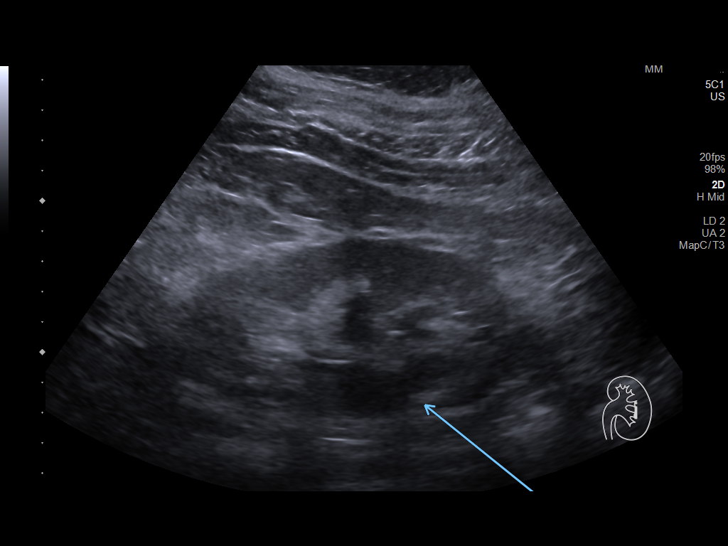
[im 34/74]
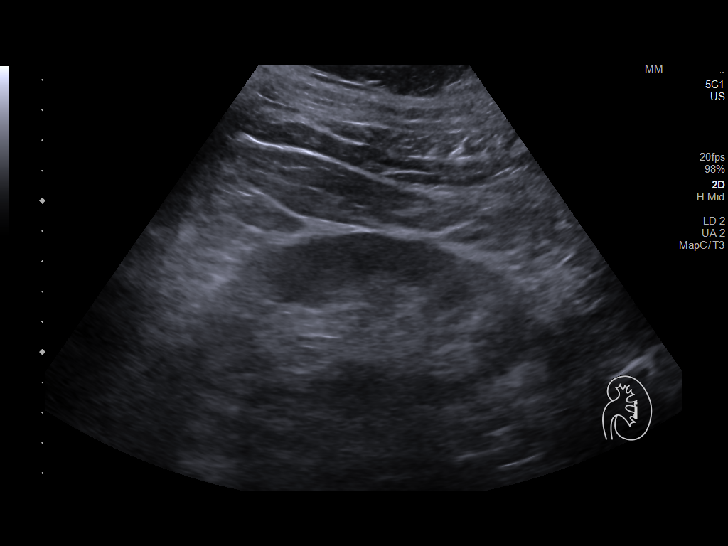
[im 40/74]
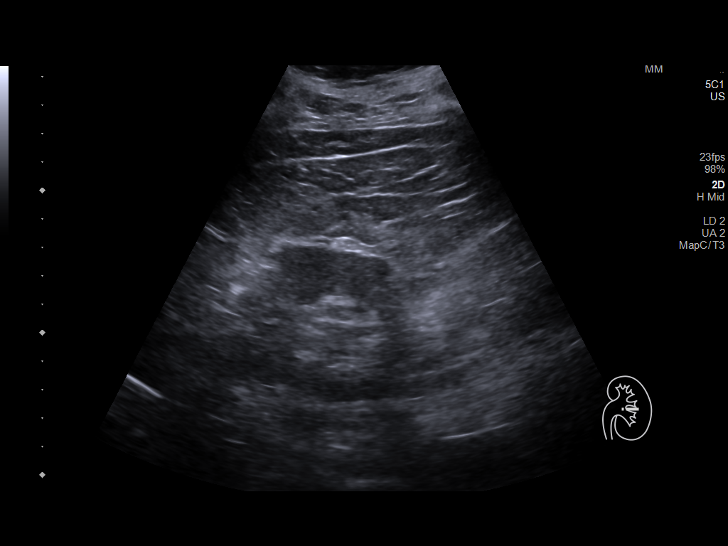
[im 46/74]
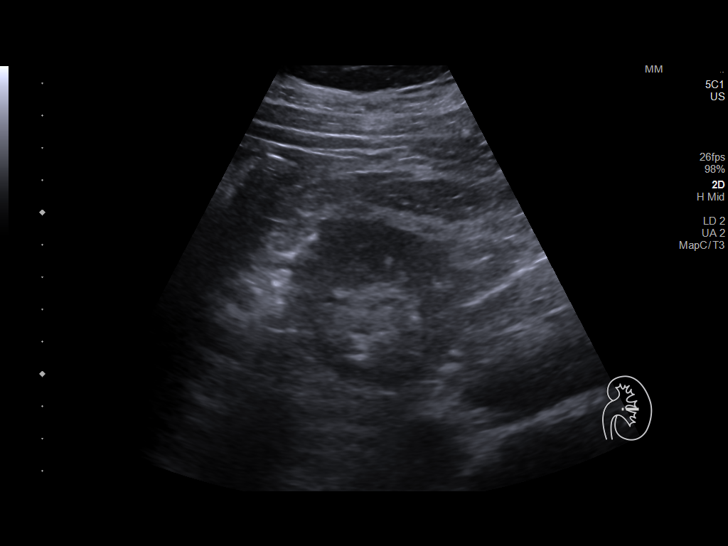
[im 49/74]
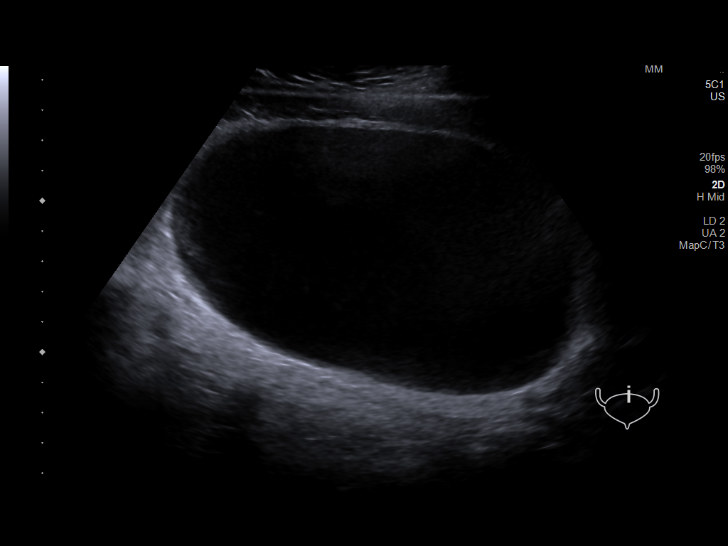
[im 55/74]
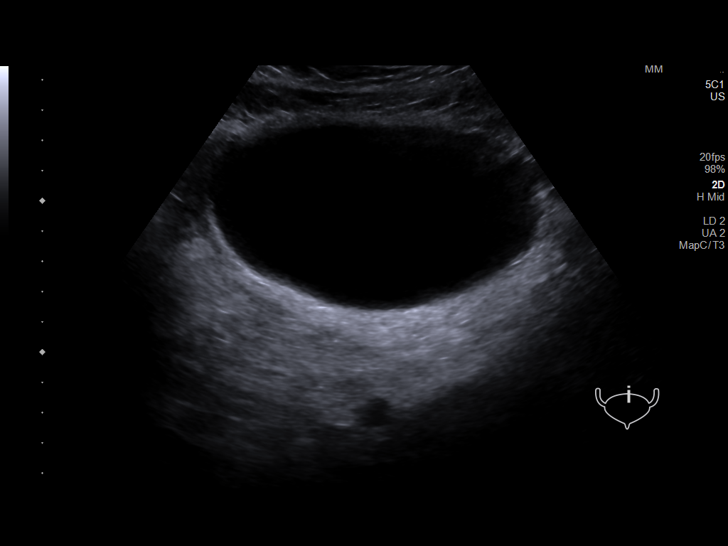
[im 61/74]
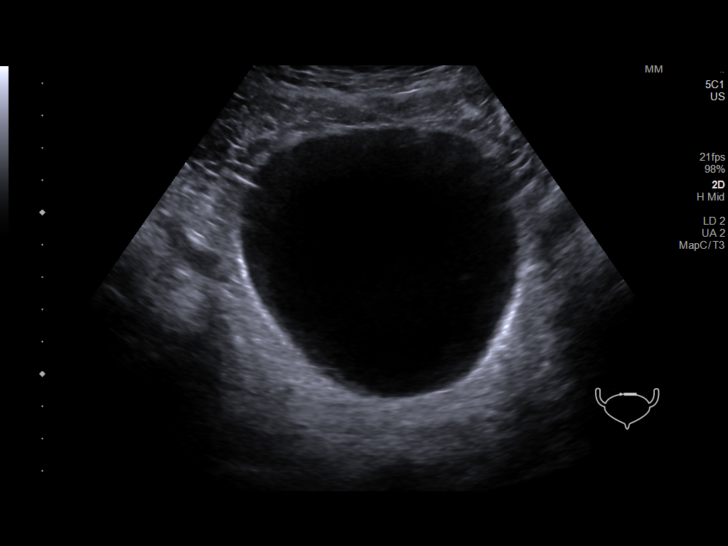
[im 67/74]
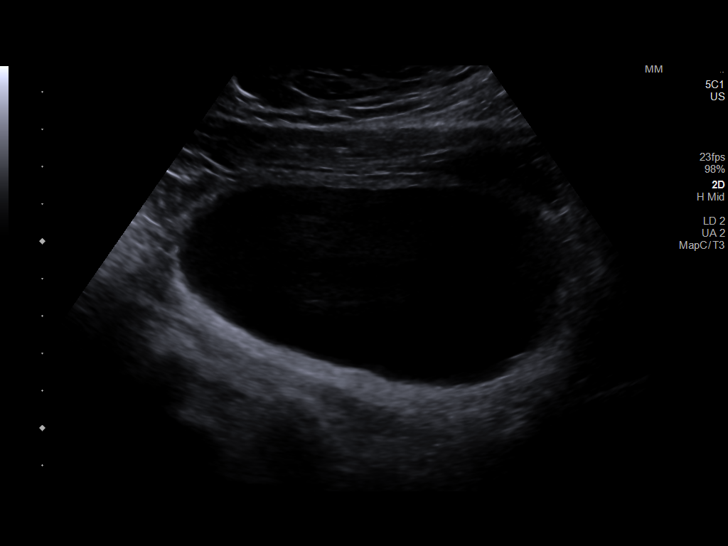
[im 74/74]
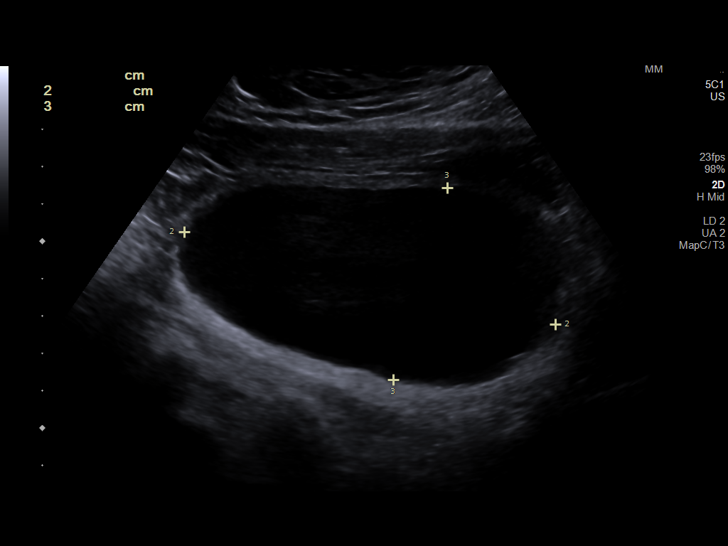

[14 of 25 positions shown; findings below may reference images not displayed]

FINDINGS: Right Kidney:

Renal measurements: 11.8 x 5.5 x 8.1 cm = volume: 271 mL.
Echogenicity within normal limits. No mass or hydronephrosis
visualized. No nephrolithiasis.

Left Kidney:

Renal measurements: 11.2 x 5.9 x 5.7 cm = volume: 199 mL.
Echogenicity within normal limits. No nephrolithiasis. Probable
multiple parapelvic cyst noted about the left renal pelvis, grossly
similar to previous CT. No definite hydronephrosis or other discrete
renal lesion.

Bladder:

Appears normal for degree of bladder distention.

Other:

None.
IMPRESSION: 1. Multiple left-sided parapelvic cysts, similar as compared to
recent CT from 04/13/2019. No definite obstructive uropathy.
2. Otherwise unremarkable and normal renal ultrasound.

## 2020-11-09 DIAGNOSIS — N182 Chronic kidney disease, stage 2 (mild): Secondary | ICD-10-CM | POA: Diagnosis not present

## 2020-11-09 DIAGNOSIS — E211 Secondary hyperparathyroidism, not elsewhere classified: Secondary | ICD-10-CM | POA: Diagnosis not present

## 2020-11-09 DIAGNOSIS — R7303 Prediabetes: Secondary | ICD-10-CM | POA: Diagnosis not present

## 2020-11-09 DIAGNOSIS — I129 Hypertensive chronic kidney disease with stage 1 through stage 4 chronic kidney disease, or unspecified chronic kidney disease: Secondary | ICD-10-CM | POA: Diagnosis not present

## 2020-11-16 DIAGNOSIS — E211 Secondary hyperparathyroidism, not elsewhere classified: Secondary | ICD-10-CM | POA: Diagnosis not present

## 2020-11-16 DIAGNOSIS — I129 Hypertensive chronic kidney disease with stage 1 through stage 4 chronic kidney disease, or unspecified chronic kidney disease: Secondary | ICD-10-CM | POA: Diagnosis not present

## 2020-11-16 DIAGNOSIS — R7303 Prediabetes: Secondary | ICD-10-CM | POA: Diagnosis not present

## 2020-11-16 DIAGNOSIS — N182 Chronic kidney disease, stage 2 (mild): Secondary | ICD-10-CM | POA: Diagnosis not present

## 2021-05-08 DIAGNOSIS — Z0001 Encounter for general adult medical examination with abnormal findings: Secondary | ICD-10-CM | POA: Diagnosis not present

## 2021-05-08 DIAGNOSIS — E782 Mixed hyperlipidemia: Secondary | ICD-10-CM | POA: Diagnosis not present

## 2021-05-08 DIAGNOSIS — Z23 Encounter for immunization: Secondary | ICD-10-CM | POA: Diagnosis not present

## 2021-05-08 DIAGNOSIS — K219 Gastro-esophageal reflux disease without esophagitis: Secondary | ICD-10-CM | POA: Diagnosis not present

## 2021-05-08 DIAGNOSIS — I10 Essential (primary) hypertension: Secondary | ICD-10-CM | POA: Diagnosis not present

## 2021-05-08 DIAGNOSIS — Z1331 Encounter for screening for depression: Secondary | ICD-10-CM | POA: Diagnosis not present

## 2021-06-07 DIAGNOSIS — E6609 Other obesity due to excess calories: Secondary | ICD-10-CM | POA: Diagnosis not present

## 2021-06-07 DIAGNOSIS — I129 Hypertensive chronic kidney disease with stage 1 through stage 4 chronic kidney disease, or unspecified chronic kidney disease: Secondary | ICD-10-CM | POA: Diagnosis not present

## 2021-06-07 DIAGNOSIS — N182 Chronic kidney disease, stage 2 (mild): Secondary | ICD-10-CM | POA: Diagnosis not present

## 2021-06-07 DIAGNOSIS — E211 Secondary hyperparathyroidism, not elsewhere classified: Secondary | ICD-10-CM | POA: Diagnosis not present

## 2021-07-02 DIAGNOSIS — J329 Chronic sinusitis, unspecified: Secondary | ICD-10-CM | POA: Diagnosis not present

## 2021-08-07 DIAGNOSIS — U071 COVID-19: Secondary | ICD-10-CM | POA: Diagnosis not present

## 2021-12-09 DIAGNOSIS — E211 Secondary hyperparathyroidism, not elsewhere classified: Secondary | ICD-10-CM | POA: Diagnosis not present

## 2021-12-09 DIAGNOSIS — N182 Chronic kidney disease, stage 2 (mild): Secondary | ICD-10-CM | POA: Diagnosis not present

## 2021-12-09 DIAGNOSIS — I129 Hypertensive chronic kidney disease with stage 1 through stage 4 chronic kidney disease, or unspecified chronic kidney disease: Secondary | ICD-10-CM | POA: Diagnosis not present

## 2021-12-11 DIAGNOSIS — N182 Chronic kidney disease, stage 2 (mild): Secondary | ICD-10-CM | POA: Diagnosis not present

## 2021-12-11 DIAGNOSIS — Z6837 Body mass index (BMI) 37.0-37.9, adult: Secondary | ICD-10-CM | POA: Diagnosis not present

## 2021-12-11 DIAGNOSIS — R7303 Prediabetes: Secondary | ICD-10-CM | POA: Diagnosis not present

## 2021-12-11 DIAGNOSIS — I129 Hypertensive chronic kidney disease with stage 1 through stage 4 chronic kidney disease, or unspecified chronic kidney disease: Secondary | ICD-10-CM | POA: Diagnosis not present

## 2022-05-14 DIAGNOSIS — Z23 Encounter for immunization: Secondary | ICD-10-CM | POA: Diagnosis not present

## 2022-05-14 DIAGNOSIS — R7303 Prediabetes: Secondary | ICD-10-CM | POA: Diagnosis not present

## 2022-05-14 DIAGNOSIS — Z6833 Body mass index (BMI) 33.0-33.9, adult: Secondary | ICD-10-CM | POA: Diagnosis not present

## 2022-05-14 DIAGNOSIS — K219 Gastro-esophageal reflux disease without esophagitis: Secondary | ICD-10-CM | POA: Diagnosis not present

## 2022-05-14 DIAGNOSIS — E6609 Other obesity due to excess calories: Secondary | ICD-10-CM | POA: Diagnosis not present

## 2022-05-14 DIAGNOSIS — Z1331 Encounter for screening for depression: Secondary | ICD-10-CM | POA: Diagnosis not present

## 2022-05-14 DIAGNOSIS — I10 Essential (primary) hypertension: Secondary | ICD-10-CM | POA: Diagnosis not present

## 2022-05-14 DIAGNOSIS — Z0001 Encounter for general adult medical examination with abnormal findings: Secondary | ICD-10-CM | POA: Diagnosis not present

## 2022-06-17 DIAGNOSIS — I129 Hypertensive chronic kidney disease with stage 1 through stage 4 chronic kidney disease, or unspecified chronic kidney disease: Secondary | ICD-10-CM | POA: Diagnosis not present

## 2022-06-17 DIAGNOSIS — R7303 Prediabetes: Secondary | ICD-10-CM | POA: Diagnosis not present

## 2022-06-17 DIAGNOSIS — Z6837 Body mass index (BMI) 37.0-37.9, adult: Secondary | ICD-10-CM | POA: Diagnosis not present

## 2022-06-17 DIAGNOSIS — N182 Chronic kidney disease, stage 2 (mild): Secondary | ICD-10-CM | POA: Diagnosis not present

## 2022-06-30 NOTE — Progress Notes (Signed)
History of Present Illness: 61 yo male is referred by Dr Gerarda Fraction for E/M of elevated PSA.  Past Medical History:  Diagnosis Date   Hypertension     Past Surgical History:  Procedure Laterality Date   COLONOSCOPY N/A 11/29/2015   Procedure: COLONOSCOPY;  Surgeon: Rogene Houston, MD;  Location: [facility];  Service: Endoscopy;  Laterality: N/A;  930   MASS EXCISION Right 06/05/2017   Procedure: EXCISION SUBCUTANEOUS MASS RIGHT FLANK 8CM;  Surgeon: Aviva Signs, MD;  Location: [facility];  Service: General;  Laterality: Right;   NO PAST SURGERIES      Home Medications:  Allergies as of 07/01/2022   No Known Allergies      Medication List        Accurate as of June 30, 2022  7:32 PM. If you have any questions, ask your nurse or doctor.          amLODipine-olmesartan 5-20 MG tablet Commonly known as: AZOR Take 1 tablet by mouth daily.   ascorbic acid 500 MG tablet Commonly known as: VITAMIN C Take 500 mg by mouth daily.   Fish Oil 500 MG Caps Take 500 mg by mouth daily.   GARLIC PO Take 1 capsule by mouth daily.   HYDROcodone-acetaminophen 5-325 MG tablet Commonly known as: Norco Take 1 tablet by mouth every 4 (four) hours as needed for moderate pain.        Allergies: No Known Allergies  No family history on file.  Social History:  reports that he quit smoking about 39 years ago. He has never used smokeless tobacco. He reports that he does not drink alcohol and does not use drugs.  ROS: A complete review of systems was performed.  All systems are negative except for pertinent findings as noted.  Physical Exam:  Vital signs in last 24 hours: There were no vitals taken for this visit. Constitutional:  Alert and oriented, No acute distress Cardiovascular: Regular rate  Respiratory: Normal respiratory effort GI: Abdomen is soft, nontender, nondistended, no abdominal masses. No CVAT.  Genitourinary: Normal male phallus, testes are descended bilaterally  and non-tender and without masses, scrotum is normal in appearance without lesions or masses, perineum is normal on inspection. Lymphatic: No lymphadenopathy Neurologic: Grossly intact, no focal deficits Psychiatric: Normal mood and affect  I have reviewed prior pt notes  I have reviewed notes from referring/previous physicians  I have reviewed urinalysis results  I have independently reviewed prior imaging  I have reviewed prior PSA results  I have reviewed prior urine culture   Impression/Assessment:  ***  Plan:  ***

## 2022-07-01 ENCOUNTER — Ambulatory Visit (INDEPENDENT_AMBULATORY_CARE_PROVIDER_SITE_OTHER): Payer: BC Managed Care – PPO | Admitting: Urology

## 2022-07-01 VITALS — BP 155/95 | HR 67 | Ht 67.0 in | Wt 235.0 lb

## 2022-07-01 DIAGNOSIS — R972 Elevated prostate specific antigen [PSA]: Secondary | ICD-10-CM | POA: Diagnosis not present

## 2022-07-01 DIAGNOSIS — N138 Other obstructive and reflux uropathy: Secondary | ICD-10-CM | POA: Diagnosis not present

## 2022-07-01 DIAGNOSIS — N401 Enlarged prostate with lower urinary tract symptoms: Secondary | ICD-10-CM

## 2022-07-02 LAB — URINALYSIS, ROUTINE W REFLEX MICROSCOPIC
Bilirubin, UA: NEGATIVE
Glucose, UA: NEGATIVE
Ketones, UA: NEGATIVE
Leukocytes,UA: NEGATIVE
Nitrite, UA: NEGATIVE
Protein,UA: NEGATIVE
RBC, UA: NEGATIVE
Specific Gravity, UA: 1.02 (ref 1.005–1.030)
Urobilinogen, Ur: 0.2 mg/dL (ref 0.2–1.0)
pH, UA: 5.5 (ref 5.0–7.5)

## 2022-07-02 LAB — PSA: Prostate Specific Ag, Serum: 3.1 ng/mL (ref 0.0–4.0)

## 2022-07-21 DIAGNOSIS — R7303 Prediabetes: Secondary | ICD-10-CM | POA: Diagnosis not present

## 2022-07-21 DIAGNOSIS — N182 Chronic kidney disease, stage 2 (mild): Secondary | ICD-10-CM | POA: Diagnosis not present

## 2022-07-21 DIAGNOSIS — I129 Hypertensive chronic kidney disease with stage 1 through stage 4 chronic kidney disease, or unspecified chronic kidney disease: Secondary | ICD-10-CM | POA: Diagnosis not present

## 2022-07-21 DIAGNOSIS — Z6836 Body mass index (BMI) 36.0-36.9, adult: Secondary | ICD-10-CM | POA: Diagnosis not present

## 2022-12-04 ENCOUNTER — Encounter (INDEPENDENT_AMBULATORY_CARE_PROVIDER_SITE_OTHER): Payer: Self-pay | Admitting: *Deleted

## 2022-12-23 ENCOUNTER — Telehealth (INDEPENDENT_AMBULATORY_CARE_PROVIDER_SITE_OTHER): Payer: Self-pay | Admitting: Gastroenterology

## 2022-12-23 NOTE — Telephone Encounter (Signed)
Who is your primary care physician: F[facility]  Reasons for the colonoscopy: Recall  Have you had a colonoscopy before?  Yes 7 years ago  Do you have family history of colon cancer? no  Previous colonoscopy with polyps removed? Yes 7 and 12 years ago  Do you have a history colorectal cancer?   no  Are you diabetic? If yes, Type 1 or Type 2?    no  Do you have a prosthetic or mechanical heart valve? no  Do you have a pacemaker/defibrillator?   no  Have you had endocarditis/atrial fibrillation? no  Have you had joint replacement within the last 12 months?  no  Do you tend to be constipated or have to use laxatives? no  Do you have any history of drugs or alchohol?  no  Do you use supplemental oxygen?  no  Have you had a stroke or heart attack within the last 6 months? no  Do you take weight loss medication? no  Do you take any blood-thinning medications such as: (aspirin, warfarin, Plavix, Aggrenox)  yes  If yes we need the name, milligram, dosage and who is prescribing doctor Aspirin 81 mg Current Outpatient Medications on File Prior to Visit  Medication Sig Dispense Refill   amLODipine-olmesartan (AZOR) 5-20 MG tablet Take 1 tablet by mouth daily.  0   Cholecalciferol 75 MCG (3000 UT) TABS Take by mouth.     Cinnamon 500 MG TABS Take 500 mg by mouth.     GARLIC PO Take 1 capsule by mouth daily.     Omega-3 Fatty Acids (FISH OIL) 500 MG CAPS Take 500 mg by mouth daily.     vitamin C (ASCORBIC ACID) 500 MG tablet Take 500 mg by mouth daily.     No current facility-administered medications on file prior to visit.    No Known Allergies   Pharmacy: [facility] Insurance Name: [place]  Best number where you can be reached: [phone]

## 2022-12-23 NOTE — Telephone Encounter (Signed)
Any room Thanks 

## 2022-12-24 ENCOUNTER — Telehealth (INDEPENDENT_AMBULATORY_CARE_PROVIDER_SITE_OTHER): Payer: Self-pay | Admitting: Gastroenterology

## 2022-12-24 MED ORDER — PEG 3350-KCL-NA BICARB-NACL 420 G PO SOLR: 4000.0000 mL | Freq: Once | ORAL | 0 refills | Status: AC

## 2022-12-24 NOTE — Addendum Note (Signed)
Addended by: Marlowe Shores on: 12/24/2022 10:40 AM   Modules accepted: Orders

## 2022-12-24 NOTE — Telephone Encounter (Signed)
Pt contacted and colonoscopy scheduled for 01/22/23 at 1:30 pm. Prep sent to pharmacy and instructions mailed to patient. No pa needed per [employer]

## 2022-12-24 NOTE — Telephone Encounter (Signed)
Questionnaire from recall, no referral needed  

## 2022-12-24 NOTE — Telephone Encounter (Signed)
Pt called and is needing to reschedule TCS from 01/22/23. Pt orignally scheduled for 01/22/23 at 1:30. Pt moved to 01/29/23 at 8:15am. Will send up dated instructions. Will send message to endo

## 2023-01-07 DIAGNOSIS — K219 Gastro-esophageal reflux disease without esophagitis: Secondary | ICD-10-CM | POA: Diagnosis not present

## 2023-01-07 DIAGNOSIS — I10 Essential (primary) hypertension: Secondary | ICD-10-CM | POA: Diagnosis not present

## 2023-01-07 DIAGNOSIS — R6889 Other general symptoms and signs: Secondary | ICD-10-CM | POA: Diagnosis not present

## 2023-01-07 DIAGNOSIS — Z6833 Body mass index (BMI) 33.0-33.9, adult: Secondary | ICD-10-CM | POA: Diagnosis not present

## 2023-01-07 DIAGNOSIS — R7303 Prediabetes: Secondary | ICD-10-CM | POA: Diagnosis not present

## 2023-01-07 DIAGNOSIS — R509 Fever, unspecified: Secondary | ICD-10-CM | POA: Diagnosis not present

## 2023-01-07 DIAGNOSIS — Z20828 Contact with and (suspected) exposure to other viral communicable diseases: Secondary | ICD-10-CM | POA: Diagnosis not present

## 2023-01-07 DIAGNOSIS — E6609 Other obesity due to excess calories: Secondary | ICD-10-CM | POA: Diagnosis not present

## 2023-01-19 DIAGNOSIS — N189 Chronic kidney disease, unspecified: Secondary | ICD-10-CM | POA: Diagnosis not present

## 2023-01-19 DIAGNOSIS — R7303 Prediabetes: Secondary | ICD-10-CM | POA: Diagnosis not present

## 2023-01-19 DIAGNOSIS — E211 Secondary hyperparathyroidism, not elsewhere classified: Secondary | ICD-10-CM | POA: Diagnosis not present

## 2023-01-19 DIAGNOSIS — R809 Proteinuria, unspecified: Secondary | ICD-10-CM | POA: Diagnosis not present

## 2023-01-19 DIAGNOSIS — Z79899 Other long term (current) drug therapy: Secondary | ICD-10-CM | POA: Diagnosis not present

## 2023-01-19 DIAGNOSIS — N182 Chronic kidney disease, stage 2 (mild): Secondary | ICD-10-CM | POA: Diagnosis not present

## 2023-01-21 DIAGNOSIS — R7303 Prediabetes: Secondary | ICD-10-CM | POA: Diagnosis not present

## 2023-01-21 DIAGNOSIS — Z6834 Body mass index (BMI) 34.0-34.9, adult: Secondary | ICD-10-CM | POA: Diagnosis not present

## 2023-01-21 DIAGNOSIS — N2581 Secondary hyperparathyroidism of renal origin: Secondary | ICD-10-CM | POA: Diagnosis not present

## 2023-01-21 DIAGNOSIS — I129 Hypertensive chronic kidney disease with stage 1 through stage 4 chronic kidney disease, or unspecified chronic kidney disease: Secondary | ICD-10-CM | POA: Diagnosis not present

## 2023-01-29 ENCOUNTER — Encounter (HOSPITAL_COMMUNITY): Payer: Self-pay | Admitting: Gastroenterology

## 2023-01-29 ENCOUNTER — Ambulatory Visit (HOSPITAL_COMMUNITY): Payer: BC Managed Care – PPO | Admitting: Anesthesiology

## 2023-01-29 ENCOUNTER — Other Ambulatory Visit: Payer: Self-pay

## 2023-01-29 ENCOUNTER — Ambulatory Visit (HOSPITAL_COMMUNITY)
Admission: RE | Admit: 2023-01-29 | Discharge: 2023-01-29 | Disposition: A | Discharge status: Home / Self Care | Payer: BC Managed Care – PPO | Attending: Gastroenterology | Admitting: Gastroenterology

## 2023-01-29 ENCOUNTER — Encounter (HOSPITAL_COMMUNITY)
Admission: RE | Disposition: A | Discharge status: Home / Self Care | Payer: Self-pay | Source: Home / Self Care | Attending: Gastroenterology

## 2023-01-29 DIAGNOSIS — D122 Benign neoplasm of ascending colon: Secondary | ICD-10-CM | POA: Insufficient documentation

## 2023-01-29 DIAGNOSIS — K5730 Diverticulosis of large intestine without perforation or abscess without bleeding: Secondary | ICD-10-CM | POA: Diagnosis not present

## 2023-01-29 DIAGNOSIS — K648 Other hemorrhoids: Secondary | ICD-10-CM | POA: Insufficient documentation

## 2023-01-29 DIAGNOSIS — D124 Benign neoplasm of descending colon: Secondary | ICD-10-CM | POA: Diagnosis not present

## 2023-01-29 DIAGNOSIS — G4730 Sleep apnea, unspecified: Secondary | ICD-10-CM | POA: Insufficient documentation

## 2023-01-29 DIAGNOSIS — Z1211 Encounter for screening for malignant neoplasm of colon: Secondary | ICD-10-CM | POA: Diagnosis not present

## 2023-01-29 DIAGNOSIS — I10 Essential (primary) hypertension: Secondary | ICD-10-CM | POA: Diagnosis not present

## 2023-01-29 DIAGNOSIS — Z87891 Personal history of nicotine dependence: Secondary | ICD-10-CM | POA: Insufficient documentation

## 2023-01-29 DIAGNOSIS — Z09 Encounter for follow-up examination after completed treatment for conditions other than malignant neoplasm: Secondary | ICD-10-CM | POA: Insufficient documentation

## 2023-01-29 DIAGNOSIS — D123 Benign neoplasm of transverse colon: Secondary | ICD-10-CM | POA: Diagnosis not present

## 2023-01-29 DIAGNOSIS — Z79899 Other long term (current) drug therapy: Secondary | ICD-10-CM | POA: Insufficient documentation

## 2023-01-29 DIAGNOSIS — Z86010 Personal history of colonic polyps: Secondary | ICD-10-CM | POA: Diagnosis not present

## 2023-01-29 DIAGNOSIS — D128 Benign neoplasm of rectum: Secondary | ICD-10-CM | POA: Diagnosis not present

## 2023-01-29 DIAGNOSIS — K635 Polyp of colon: Secondary | ICD-10-CM | POA: Diagnosis not present

## 2023-01-29 DIAGNOSIS — D120 Benign neoplasm of cecum: Secondary | ICD-10-CM | POA: Diagnosis not present

## 2023-01-29 HISTORY — DX: Sleep apnea, unspecified: G47.30

## 2023-01-29 HISTORY — PX: BIOPSY: SHX5522

## 2023-01-29 HISTORY — PX: POLYPECTOMY: SHX5525

## 2023-01-29 HISTORY — PX: COLONOSCOPY WITH PROPOFOL: SHX5780

## 2023-01-29 LAB — HM COLONOSCOPY

## 2023-01-29 SURGERY — COLONOSCOPY WITH PROPOFOL
Anesthesia: General

## 2023-01-29 MED ORDER — LACTATED RINGERS IV SOLN: INTRAVENOUS | Status: DC

## 2023-01-29 MED ORDER — STERILE WATER FOR IRRIGATION IR SOLN
Status: DC | PRN
  Administered 2023-01-29: 120 mL

## 2023-01-29 MED ORDER — PHENYLEPHRINE 80 MCG/ML (10ML) SYRINGE FOR IV PUSH (FOR BLOOD PRESSURE SUPPORT)
PREFILLED_SYRINGE | INTRAVENOUS | Status: DC | PRN
  Administered 2023-01-29 (×3): 160 ug via INTRAVENOUS

## 2023-01-29 MED ORDER — EPHEDRINE 5 MG/ML INJ
INTRAVENOUS | Status: AC
  Filled 2023-01-29: qty 5

## 2023-01-29 MED ORDER — PROPOFOL 500 MG/50ML IV EMUL
INTRAVENOUS | Status: DC | PRN
  Administered 2023-01-29: 150 ug/kg/min via INTRAVENOUS

## 2023-01-29 MED ORDER — LIDOCAINE HCL (CARDIAC) PF 100 MG/5ML IV SOSY
PREFILLED_SYRINGE | INTRAVENOUS | Status: DC | PRN
  Administered 2023-01-29: 50 mg via INTRAVENOUS

## 2023-01-29 MED ORDER — PHENYLEPHRINE 80 MCG/ML (10ML) SYRINGE FOR IV PUSH (FOR BLOOD PRESSURE SUPPORT)
PREFILLED_SYRINGE | INTRAVENOUS | Status: AC
  Filled 2023-01-29: qty 10

## 2023-01-29 MED ORDER — PROPOFOL 10 MG/ML IV BOLUS
INTRAVENOUS | Status: DC | PRN
  Administered 2023-01-29: 100 mg via INTRAVENOUS

## 2023-01-29 MED ORDER — EPHEDRINE SULFATE-NACL 50-0.9 MG/10ML-% IV SOSY
PREFILLED_SYRINGE | INTRAVENOUS | Status: DC | PRN
  Administered 2023-01-29: 5 mg via INTRAVENOUS

## 2023-01-29 NOTE — Transfer of Care (Signed)
Immediate Anesthesia Transfer of Care Note  Patient: Kurt Powell  Procedure(s) Performed: COLONOSCOPY WITH PROPOFOL POLYPECTOMY BIOPSY  Patient Location: Endoscopy Unit  Anesthesia Type:General  Level of Consciousness: awake  Airway & Oxygen Therapy: Patient Spontanous Breathing  Post-op Assessment: Report given to RN and Post -op Vital signs reviewed and stable  Post vital signs: Reviewed and stable  Last Vitals:  Vitals Value Taken Time  BP 91/55 01/29/23 0915  Temp 36.4 C 01/29/23 0915  Pulse 77 01/29/23 0915  Resp 18 01/29/23 0915  SpO2 96 % 01/29/23 0915    Last Pain:  Vitals:   01/29/23 0915  TempSrc: Oral  PainSc: 0-No pain      Patients Stated Pain Goal: 8 (01/29/23 0714)  Complications: No notable events documented.

## 2023-01-29 NOTE — H&P (Signed)
Kurt Powell is an 62 y.o. male.   Chief Complaint: screening colonoscopy HPI: 61 y/o M with PMH hypertension and sleep apnea, coming for screening colonoscopy.  Last colonoscopy performed in 2017, no polyps were found.  The patient denies having any complaints such as melena, hematochezia, abdominal pain or distention, change in her bowel movement consistency or frequency, no changes in weight recently.  No family history of colorectal cancer.   Past Medical History:  Diagnosis Date   Hypertension    Sleep apnea     Past Surgical History:  Procedure Laterality Date   COLONOSCOPY N/A 11/29/2015   Procedure: COLONOSCOPY;  Surgeon: Malissa Hippo, MD;  Location: [facility];  Service: Endoscopy;  Laterality: N/A;  930   MASS EXCISION Right 06/05/2017   Procedure: EXCISION SUBCUTANEOUS MASS RIGHT FLANK 8CM;  Surgeon: Franky Macho, MD;  Location: [facility];  Service: General;  Laterality: Right;   NO PAST SURGERIES      History reviewed. No pertinent family history. Social History:  reports that he quit smoking about 40 years ago. His smoking use included cigarettes. He has never used smokeless tobacco. He reports that he does not drink alcohol and does not use drugs.  Allergies: No Known Allergies  Medications Prior to Admission  Medication Sig Dispense Refill   amLODipine-olmesartan (AZOR) 5-20 MG tablet Take 1 tablet by mouth daily.  0   Cholecalciferol 75 MCG (3000 UT) TABS Take 3,000 Units by mouth daily.     Cinnamon 500 MG TABS Take 500 mg by mouth daily.     GARLIC PO Take 1 capsule by mouth daily.     Omega-3 Fatty Acids (FISH OIL) 500 MG CAPS Take 500 mg by mouth daily.     vitamin C (ASCORBIC ACID) 500 MG tablet Take 500 mg by mouth daily.      No results found for this or any previous visit (from the past 48 hour(s)). No results found.  Review of Systems  All other systems reviewed and are negative.   Blood pressure 124/76, pulse 72, temperature 98.1 F (36.7 C),  temperature source Oral, resp. rate 17, height 5\' 7"  (1.702 m), weight 102.1 kg, SpO2 98 %. Physical Exam  GENERAL: The patient is AO x3, in no acute distress. HEENT: Head is normocephalic and atraumatic. EOMI are intact. Mouth is well hydrated and without lesions. NECK: Supple. No masses LUNGS: Clear to auscultation. No presence of rhonchi/wheezing/rales. Adequate chest expansion HEART: RRR, normal s1 and s2. ABDOMEN: Soft, nontender, no guarding, no peritoneal signs, and nondistended. BS +. No masses. EXTREMITIES: Without any cyanosis, clubbing, rash, lesions or edema. NEUROLOGIC: AOx3, no focal motor deficit. SKIN: no jaundice, no rashes  Assessment/Plan 62 y/o M with PMH hypertension and sleep apnea, coming for screening colonoscopy.  The patient is at average risk for colorectal cancer.  We will proceed with colonoscopy today.   Dolores Frame, MD 01/29/2023, 7:38 AM

## 2023-01-29 NOTE — Discharge Instructions (Signed)
You are being discharged to home.  Resume your previous diet.  We are waiting for your pathology results.  Your physician has recommended a repeat colonoscopy in three years for surveillance.  

## 2023-01-29 NOTE — Anesthesia Preprocedure Evaluation (Addendum)
Anesthesia Evaluation  Patient identified by MRN, date of birth, ID band Patient awake    Reviewed: Allergy & Precautions, H&P , NPO status , Patient's Chart, lab work & pertinent test results  Airway Mallampati: III  TM Distance: >3 FB Neck ROM: Full    Dental no notable dental hx. (+) Dental Advisory Given, Teeth Intact   Pulmonary sleep apnea and Continuous Positive Airway Pressure Ventilation , former smoker   Pulmonary exam normal breath sounds clear to auscultation       Cardiovascular hypertension, Pt. on medications Normal cardiovascular exam Rhythm:Regular Rate:Normal     Neuro/Psych negative neurological ROS  negative psych ROS   GI/Hepatic negative GI ROS, Neg liver ROS,,,  Endo/Other  negative endocrine ROS    Renal/GU negative Renal ROS  negative genitourinary   Musculoskeletal negative musculoskeletal ROS (+)    Abdominal   Peds negative pediatric ROS (+)  Hematology negative hematology ROS (+)   Anesthesia Other Findings   Reproductive/Obstetrics negative OB ROS                             Anesthesia Physical Anesthesia Plan  ASA: 2  Anesthesia Plan: General   Post-op Pain Management: Minimal or no pain anticipated   Induction: Intravenous  PONV Risk Score and Plan: 1 and Propofol infusion  Airway Management Planned: Nasal Cannula and Natural Airway  Additional Equipment:   Intra-op Plan:   Post-operative Plan:   Informed Consent: I have reviewed the patients History and Physical, chart, labs and discussed the procedure including the risks, benefits and alternatives for the proposed anesthesia with the patient or authorized representative who has indicated his/her understanding and acceptance.     Dental advisory given  Plan Discussed with: CRNA and Surgeon  Anesthesia Plan Comments:        Anesthesia Quick Evaluation

## 2023-01-29 NOTE — Anesthesia Postprocedure Evaluation (Signed)
Anesthesia Post Note  Patient: Kurt Powell  Procedure(s) Performed: COLONOSCOPY WITH PROPOFOL POLYPECTOMY BIOPSY  Patient location during evaluation: Phase II Anesthesia Type: General Level of consciousness: awake and alert and oriented Pain management: pain level controlled Vital Signs Assessment: post-procedure vital signs reviewed and stable Respiratory status: spontaneous breathing, nonlabored ventilation and respiratory function stable Cardiovascular status: blood pressure returned to baseline and stable Postop Assessment: no apparent nausea or vomiting Anesthetic complications: no  No notable events documented.   Last Vitals:  Vitals:   01/29/23 0714 01/29/23 0915  BP: 124/76 (!) 91/55  Pulse: 72 77  Resp: 17 18  Temp: 36.7 C (!) 36.4 C  SpO2: 98% 96%    Last Pain:  Vitals:   01/29/23 0915  TempSrc: Oral  PainSc: 0-No pain                 Ruhi Kopke C Tammee Thielke

## 2023-01-29 NOTE — Op Note (Signed)
[facility] Patient Name: Kurt Powell Procedure Date: 01/29/2023 8:28 AM MRN: [id number] Date of Birth: 1961-06-16 Attending MD: Katrinka Blazing , , [id number] CSN: [id number] Age: 62 Admit Type: Outpatient Procedure:                Colonoscopy Indications:              Screening for colorectal malignant neoplasm Providers:                Katrinka Blazing, Sheran Fava Pandora Leiter,                            Technician Referring MD:              Medicines:                Monitored Anesthesia Care Complications:            No immediate complications. Estimated Blood Loss:     Estimated blood loss: none. Procedure:                Pre-Anesthesia Assessment:                           - Prior to the procedure, a History and Physical                            was performed, and patient medications, allergies                            and sensitivities were reviewed. The patient's                            tolerance of previous anesthesia was reviewed.                           - The risks and benefits of the procedure and the                            sedation options and risks were discussed with the                            patient. All questions were answered and informed                            consent was obtained.                           After obtaining informed consent, the colonoscope                            was passed under direct vision. Throughout the                            procedure, the patient's blood pressure, pulse, and                            oxygen saturations were monitored continuously. The  [id number]-HQ190L ([id number]) scope was introduced through                            the anus and advanced to the the cecum, identified                            by appendiceal orifice and ileocecal valve. The                            colonoscopy was performed without difficulty. The                            patient  tolerated the procedure well. The quality                            of the bowel preparation was excellent. Scope In: 8:47:32 AM Scope Out: 9:11:11 AM Scope Withdrawal Time: 0 hours 16 minutes 2 seconds  Total Procedure Duration: 0 hours 23 minutes 39 seconds  Findings:      The perianal and digital rectal examinations were normal.      Nine sessile polyps were found in the transverse colon, ascending colon       and cecum. The polyps were 3 to 8 mm in size. These polyps were removed       with a cold snare. Resection and retrieval were complete.      A 1 mm polyp was found in the ascending colon. The polyp was sessile.       The polyp was removed with a cold biopsy forceps. Resection and       retrieval were complete.      Three sessile polyps were found in the rectum and descending colon. The       polyps were 3 to 6 mm in size. These polyps were removed with a cold       snare. Resection and retrieval were complete.      A few medium-mouthed and small-mouthed diverticula were found in the       sigmoid colon and descending colon.      Non-bleeding internal hemorrhoids were found during retroflexion. The       hemorrhoids were small. Impression:               - Nine 3 to 8 mm polyps in the transverse colon, in                            the ascending colon and in the cecum, removed with                            a cold snare. Resected and retrieved.                           - One 1 mm polyp in the ascending colon, removed                            with a cold biopsy forceps. Resected and retrieved.                           -  Three 3 to 6 mm polyps in the rectum and in the                            descending colon, removed with a cold snare.                            Resected and retrieved.                           - Diverticulosis in the sigmoid colon and in the                            descending colon.                           - Non-bleeding internal  hemorrhoids. Moderate Sedation:      Per Anesthesia Care Recommendation:           - Discharge patient to home (ambulatory).                           - Resume previous diet.                           - Await pathology results.                           - Repeat colonoscopy in 3 years for surveillance. Procedure Code(s):        --- Professional ---                           [phone], Colonoscopy, flexible; with removal of                            tumor(s), polyp(s), or other lesion(s) by snare                            technique                           45380, 59, Colonoscopy, flexible; with biopsy,                            single or multiple Diagnosis Code(s):        --- Professional ---                           Z12.11, Encounter for screening for malignant                            neoplasm of colon                           D12.3, Benign neoplasm of transverse colon (hepatic                            flexure or splenic flexure)  D12.2, Benign neoplasm of ascending colon                           D12.0, Benign neoplasm of cecum                           D12.8, Benign neoplasm of rectum                           D12.4, Benign neoplasm of descending colon                           K64.8, Other hemorrhoids                           K57.30, Diverticulosis of large intestine without                            perforation or abscess without bleeding CPT copyright 2022 American Medical Association. All rights reserved. The codes documented in this report are preliminary and upon coder review may  be revised to meet current compliance requirements. Katrinka Blazing, MD Katrinka Blazing,  01/29/2023 9:15:45 AM This report has been signed electronically. Number of Addenda: 0

## 2023-01-29 NOTE — Anesthesia Procedure Notes (Signed)
Date/Time: 01/29/2023 8:47 AM  Performed by: Julian Reil, CRNAPre-anesthesia Checklist: Patient identified, Emergency Drugs available, Suction available and Patient being monitored Patient Re-evaluated:Patient Re-evaluated prior to induction Oxygen Delivery Method: Nasal cannula Induction Type: IV induction Placement Confirmation: positive ETCO2

## 2023-01-30 LAB — SURGICAL PATHOLOGY

## 2023-02-04 ENCOUNTER — Encounter (HOSPITAL_COMMUNITY): Payer: Self-pay | Admitting: Gastroenterology

## 2023-02-06 ENCOUNTER — Encounter (INDEPENDENT_AMBULATORY_CARE_PROVIDER_SITE_OTHER): Payer: Self-pay | Admitting: *Deleted

## 2023-02-10 ENCOUNTER — Other Ambulatory Visit (INDEPENDENT_AMBULATORY_CARE_PROVIDER_SITE_OTHER): Payer: BC Managed Care – PPO

## 2023-02-10 ENCOUNTER — Ambulatory Visit (INDEPENDENT_AMBULATORY_CARE_PROVIDER_SITE_OTHER): Payer: BC Managed Care – PPO | Admitting: Physician Assistant

## 2023-02-10 ENCOUNTER — Encounter: Payer: Self-pay | Admitting: Physician Assistant

## 2023-02-10 DIAGNOSIS — G8929 Other chronic pain: Secondary | ICD-10-CM | POA: Diagnosis not present

## 2023-02-10 DIAGNOSIS — M25562 Pain in left knee: Secondary | ICD-10-CM

## 2023-02-10 DIAGNOSIS — M25561 Pain in right knee: Secondary | ICD-10-CM

## 2023-02-10 MED ORDER — MELOXICAM 15 MG PO TABS
15.0000 mg | ORAL_TABLET | Freq: Every day | ORAL | 0 refills | Status: DC
Start: 2023-02-10 — End: 2024-05-04

## 2023-02-10 NOTE — Progress Notes (Signed)
Office Visit Note   Patient: Kurt Powell           Date of Birth: [birth year]           MRN: [id number] Visit Date: 02/10/2023              Requested by: Elfredia Nevins, MD [address] [place],  [place] [address] PCP: Elfredia Nevins, MD   Assessment & Plan: Visit Diagnoses:  1. Acute pain of both knees   2. Bilateral chronic knee pain     Plan: Pleasant 62 year old gentleman with 1 year history of bilateral knee pain.  He said he had a misstep with his left knee at that time and felt a painful pop.  Since then he has had knee pain that is alternating back-and-forth between his knees with some occasional swelling.  He has tried topical Voltaren gel without much relief.  Denies any instability.  Notices pain when he is going down the stairs after he has been walking while or after he has been sitting a while with his legs bent.  At these times rates the pain as moderate findings consistent with some early arthritis could have a meniscus tear on the left knee.  Would favor more the arthritic problem.  We talked about different options.  He would like to start with physical therapy and an oral anti-inflammatory which I will order with him.  He is going to see how he does over the next month  Follow-Up Instructions: Return in about 5 weeks (around 03/17/2023).   Orders:  Orders Placed This Encounter  Procedures   XR KNEE 3 VIEW RIGHT   XR KNEE 3 VIEW LEFT   Ambulatory referral to Physical Therapy   No orders of the defined types were placed in this encounter.     Procedures: No procedures performed   Clinical Data: No additional findings.   Subjective: No chief complaint on file.   HPI pleasant 62 year old gentleman with 1 year history of bilateral knee pain left greater than right although has gone back between the 2.  Denies any recent injuries but a year ago had a misstep on the left knee.  Had a painful pop at the time.  Now has had pain alternating between knees  mostly on the medial side  Review of Systems  All other systems reviewed and are negative.    Objective: Vital Signs: There were no vitals taken for this visit.  Physical Exam Constitutional:      Appearance: Normal appearance.  Pulmonary:     Effort: Pulmonary effort is normal.  Skin:    General: Skin is warm and dry.  Neurological:     Mental Status: He is alert.     Ortho Exam Examination of his knees no effusion no erythema compartments are soft and compressible.  He does have some grinding with range of motion.  No lateral sided joint pain mild medial sided joint pain on the left.  Compartments are soft and nontender it is have good strength.  Good varus valgus stability good endpoint on anterior draw no radicular findings Specialty Comments:  No specialty comments available.  Imaging: XR KNEE 3 VIEW RIGHT  Result Date: 02/10/2023 Three-view radiographs of the right knee were taken today.  Overall well-maintained alignment.  Some early sclerotic changes and joint space narrowing  XR KNEE 3 VIEW LEFT  Result Date: 02/10/2023 3 views left knee were taken today.  Overall well-maintained alignment.  He does have some slight narrowing  and sclerotic changes in the medial compartment no acute changes    PMFS History: Patient Active Problem List   Diagnosis Date Noted   Bilateral chronic knee pain 02/10/2023   History of colon polyps 01/29/2023   Lipoma of torso    Past Medical History:  Diagnosis Date   Hypertension    Sleep apnea     History reviewed. No pertinent family history.  Past Surgical History:  Procedure Laterality Date   BIOPSY  01/29/2023   Procedure: BIOPSY;  Surgeon: Dolores Frame, MD;  [facility]: [facility];  Service: Gastroenterology;;   COLONOSCOPY N/A 11/29/2015   Procedure: COLONOSCOPY;  Surgeon: Malissa Hippo, MD;  [facility]: [facility];  Service: Endoscopy;  Laterality: N/A;  930   COLONOSCOPY WITH PROPOFOL N/A 01/29/2023    Procedure: COLONOSCOPY WITH PROPOFOL;  Surgeon: Dolores Frame, MD;  [facility]: [facility];  Service: Gastroenterology;  Laterality: N/A;  1:30PM;ASA 1-2   MASS EXCISION Right 06/05/2017   Procedure: EXCISION SUBCUTANEOUS MASS RIGHT FLANK 8CM;  Surgeon: Franky Macho, MD;  Location: [facility];  Service: General;  Laterality: Right;   NO PAST SURGERIES     POLYPECTOMY  01/29/2023   Procedure: POLYPECTOMY;  Surgeon: Dolores Frame, MD;  [facility]: [facility];  Service: Gastroenterology;;   Social History   Occupational History   Not on file  Tobacco Use   Smoking status: Former    Types: Cigarettes    Quit date: 11/29/1982    Years since quitting: 40.2   Smokeless tobacco: Never  Vaping Use   Vaping Use: Never used  Substance and Sexual Activity   Alcohol use: No   Drug use: No   Sexual activity: Not on file

## 2023-03-16 ENCOUNTER — Ambulatory Visit (HOSPITAL_COMMUNITY): Payer: BC Managed Care – PPO | Attending: Physician Assistant | Admitting: Physical Therapy

## 2023-03-16 ENCOUNTER — Encounter (HOSPITAL_COMMUNITY): Payer: Self-pay | Admitting: Physical Therapy

## 2023-03-16 ENCOUNTER — Other Ambulatory Visit: Payer: Self-pay

## 2023-03-16 DIAGNOSIS — R29898 Other symptoms and signs involving the musculoskeletal system: Secondary | ICD-10-CM | POA: Diagnosis not present

## 2023-03-16 DIAGNOSIS — M25562 Pain in left knee: Secondary | ICD-10-CM | POA: Insufficient documentation

## 2023-03-16 DIAGNOSIS — R2689 Other abnormalities of gait and mobility: Secondary | ICD-10-CM | POA: Insufficient documentation

## 2023-03-16 DIAGNOSIS — M25561 Pain in right knee: Secondary | ICD-10-CM | POA: Insufficient documentation

## 2023-03-16 DIAGNOSIS — M6281 Muscle weakness (generalized): Secondary | ICD-10-CM | POA: Insufficient documentation

## 2023-03-16 NOTE — Therapy (Signed)
OUTPATIENT PHYSICAL THERAPY LOWER EXTREMITY EVALUATION   Patient Name: Kurt Powell MRN: [id number] DOB:August 15, 1960, 62 y.o., male Today's Date: 03/16/2023  END OF SESSION:  PT End of Session - 03/16/23 0901     Visit Number 1    Number of Visits 4    Date for PT Re-Evaluation 04/13/23    Authorization Type [employer] (no auth, VL 30)    Authorization - Visit Number 1    Authorization - Number of Visits 30    PT Start Time 0901    PT Stop Time 0938    PT Time Calculation (min) 37 min    Activity Tolerance Patient tolerated treatment well    Behavior During Therapy [facility] for tasks assessed/performed             Past Medical History:  Diagnosis Date   Hypertension    Sleep apnea    Past Surgical History:  Procedure Laterality Date   BIOPSY  01/29/2023   Procedure: BIOPSY;  Surgeon: Dolores Frame, MD;  [facility];  Service: Gastroenterology;;   COLONOSCOPY N/A 11/29/2015   Procedure: COLONOSCOPY;  Surgeon: Malissa Hippo, MD;  [facility];  Service: Endoscopy;  Laterality: N/A;  930   COLONOSCOPY WITH PROPOFOL N/A 01/29/2023   Procedure: COLONOSCOPY WITH PROPOFOL;  Surgeon: Dolores Frame, MD;  [facility];  Service: Gastroenterology;  Laterality: N/A;  1:30PM;ASA 1-2   MASS EXCISION Right 06/05/2017   Procedure: EXCISION SUBCUTANEOUS MASS RIGHT FLANK 8CM;  Surgeon: Franky Macho, MD;  Location: [facility];  Service: General;  Laterality: Right;   NO PAST SURGERIES     POLYPECTOMY  01/29/2023   Procedure: POLYPECTOMY;  Surgeon: Dolores Frame, MD;  [facility];  Service: Gastroenterology;;   Patient Active Problem List   Diagnosis Date Noted   Bilateral chronic knee pain 02/10/2023   History of colon polyps 01/29/2023   Lipoma of torso     PCP: Elfredia Nevins MD  REFERRING PROVIDER: Persons, [place], [place]  REFERRING DIAG: [phone] (ICD-10-CM) - Acute pain of both knees  THERAPY DIAG:   Pain in both knees, unspecified chronicity  Muscle weakness (generalized)  Other abnormalities of gait and mobility  Other symptoms and signs involving the musculoskeletal system  Rationale for Evaluation and Treatment: Rehabilitation  ONSET DATE: about a year   SUBJECTIVE:   SUBJECTIVE STATEMENT: Patient states he hurt his knee about a year ago when stepping up on a step. L Knee popped and had immediate pain. Symptoms have been off and on since. R knee started bothering him from hobbling due to L knee. About a month ago when he went to [place], he had burning pain on inner aspect of knee when walking in parking lot. He has been taking anti inflammatory which has helped.   PERTINENT HISTORY: HTN PAIN:  Are you having pain? Yes: NPRS scale: 1/10 Pain location: R knee  Pain description: burning Aggravating factors: prolonged positioning in sitting Relieving factors: movement  PRECAUTIONS: None  WEIGHT BEARING RESTRICTIONS: No  FALLS:  Has patient fallen in last 6 months? No  OCCUPATION: HVAC tech  PLOF: Independent  PATIENT GOALS: learn stuff to strengthen legs   OBJECTIVE:   DIAGNOSTIC FINDINGS: 03/16/23 XR "3 views left knee were taken today.  Overall well-maintained alignment.   He does have some slight narrowing and sclerotic changes in the medial  compartment no acute changes " XR "Three-view radiographs of the right knee were taken  today.  Overall  well-maintained alignment.  Some early sclerotic changes and joint space  narrowing "  COGNITION: Overall cognitive status: Within functional limits for tasks assessed     SENSATION: WFL  PALPATION: No TTP  LOWER EXTREMITY ROM:  Active ROM Right eval Left eval  Hip flexion    Hip extension    Hip abduction    Hip adduction    Hip internal rotation    Hip external rotation    Knee flexion 127 130  Knee extension 0 0  Ankle dorsiflexion    Ankle plantarflexion    Ankle inversion    Ankle eversion      (Blank rows = not tested)  LOWER EXTREMITY MMT:  MMT Right eval Left eval  Hip flexion 5 5  Hip extension 4+ 4+  Hip abduction 4+ 4+  Hip adduction    Hip internal rotation    Hip external rotation    Knee flexion 5 5  Knee extension 5 5  Ankle dorsiflexion 5 5  Ankle plantarflexion    Ankle inversion    Ankle eversion     (Blank rows = not tested)  FUNCTIONAL TESTS:  STS: without UE support Forward step down test: bilateral eccentric weakness without UE use, mild dynamic knee valgus bilaterally R>L Squat: able to complete deep squat with impaired motor control, weight shift off LLE with increased depth, knee pain bilaterally GAIT: Distance walked: 150 feet  Assistive device utilized: None Level of assistance: Complete Independence Comments: WFL   TODAY'S TREATMENT:                                                                                                                              DATE:  03/16/23 Squat 1 x 10  Sidelying hip abduction 2 x 10 Prone hip extension 2 x 10 LAQ 10 x 5-10 second holds Step up 7 inch 2 x 10 bilateral      PATIENT EDUCATION:  Education details: Patient educated on exam findings, POC, scope of PT, HEP. Person educated: Patient Education method: Explanation, Demonstration, and Handouts Education comprehension: verbalized understanding, returned demonstration, verbal cues required, and tactile cues required  HOME EXERCISE PROGRAM: Access Code: [email or web address] URL: [email or web address]  Date: 03/16/2023 - Squat  - 1 x daily - 7 x weekly - 2 sets - 10 reps - Sidelying Hip Abduction  - 1 x daily - 7 x weekly - 2 sets - 10 reps - Prone Hip Extension  - 1 x daily - 7 x weekly - 2 sets - 10 reps - Seated Long Arc Quad  - 3 x daily - 7 x weekly - 1 sets - 10 reps - 5-10 second hold - Step Up  - 1 x daily - 7 x weekly - 2 sets - 10 reps  ASSESSMENT:  CLINICAL IMPRESSION: Patient a 62 y.o. y.o. male who was seen today for  physical therapy evaluation and treatment for bilateral knee pain. Patient  presents with pain limited deficits in bilateral knee strength, ROM, endurance, activity tolerance, and functional mobility with ADL. Patient is having to modify and restrict ADL as indicated by outcome measure score as well as subjective information and objective measures which is affecting overall participation. Patient will benefit from skilled physical therapy in order to improve function and reduce impairment.  OBJECTIVE IMPAIRMENTS: Abnormal gait, decreased activity tolerance, decreased balance, decreased endurance, decreased mobility, difficulty walking, decreased ROM, decreased strength, increased muscle spasms, impaired flexibility, improper body mechanics, and pain.   ACTIVITY LIMITATIONS: carrying, lifting, bending, standing, squatting, stairs, transfers, locomotion level, and caring for others  PARTICIPATION LIMITATIONS: meal prep, cleaning, laundry, shopping, community activity, occupation, and yard work  PERSONAL FACTORS: Time since onset of injury/illness/exacerbation and 1-2 comorbidities: HTN, chronic knee pain  are also affecting patient's functional outcome.   REHAB POTENTIAL: Good  CLINICAL DECISION MAKING: Stable/uncomplicated  EVALUATION COMPLEXITY: Low   GOALS: Goals reviewed with patient? Yes  SHORT TERM GOALS: Target date: 03/30/2023    Patient will be independent with HEP in order to improve functional outcomes. Baseline: Goal status: INITIAL  2.  Patient will report at least 25% improvement in symptoms for improved quality of life. Baseline: Goal status: INITIAL    LONG TERM GOALS: Target date: 04/13/2023    Patient will report at least 75% improvement in symptoms for improved quality of life. Baseline:  Goal status: INITIAL  2.   Patient will demonstrate grade of 5/5 MMT grade in all tested musculature as evidence of improved strength to assist with stair ambulation and gait.    Baseline: see above Goal status: INITIAL  3.  Patient will be able to complete forward step down test without compensation in order to demonstrate improved LE strength. Baseline: see above Goal status: INITIAL     PLAN:  PT FREQUENCY: 1x/week  PT DURATION: 4 weeks  PLANNED INTERVENTIONS: Therapeutic exercises, Therapeutic activity, Neuromuscular re-education, Balance training, Gait training, Patient/Family education, Joint manipulation, Joint mobilization, Stair training, Orthotic/Fit training, DME instructions, Aquatic Therapy, Dry Needling, Electrical stimulation, Spinal manipulation, Spinal mobilization, Cryotherapy, Moist heat, Compression bandaging, scar mobilization, Splintting, Taping, Traction, Ultrasound, Ionotophoresis 4mg /ml Dexamethasone, and Manual therapy  PLAN FOR NEXT SESSION: Progress HEP, functional and quad strength, possibly begin lunges, deeper squats, lateral step down, etc.   Wyman Songster, PT 03/16/2023, 9:41 AM

## 2023-03-30 ENCOUNTER — Ambulatory Visit (HOSPITAL_COMMUNITY): Payer: BC Managed Care – PPO | Admitting: Physical Therapy

## 2023-03-30 DIAGNOSIS — M6281 Muscle weakness (generalized): Secondary | ICD-10-CM | POA: Diagnosis not present

## 2023-03-30 DIAGNOSIS — R29898 Other symptoms and signs involving the musculoskeletal system: Secondary | ICD-10-CM

## 2023-03-30 DIAGNOSIS — R2689 Other abnormalities of gait and mobility: Secondary | ICD-10-CM | POA: Diagnosis not present

## 2023-03-30 DIAGNOSIS — M25562 Pain in left knee: Secondary | ICD-10-CM

## 2023-03-30 DIAGNOSIS — M25561 Pain in right knee: Secondary | ICD-10-CM | POA: Diagnosis not present

## 2023-03-30 NOTE — Therapy (Signed)
OUTPATIENT PHYSICAL THERAPY TREATMENT   Patient Name: Kurt Powell MRN: [id number] DOB:14-Nov-1960, 62 y.o., male Today's Date: 03/30/2023  END OF SESSION:  PT End of Session - 03/30/23 0742     Visit Number 2    Number of Visits 4    Date for PT Re-Evaluation 04/13/23    Authorization Type BCBS (no auth, VL 30)    Authorization - Number of Visits 30    PT Start Time 0734    PT Stop Time 0813    PT Time Calculation (min) 39 min    Activity Tolerance Patient tolerated treatment well    Behavior During Therapy [facility] for tasks assessed/performed              Past Medical History:  Diagnosis Date   Hypertension    Sleep apnea    Past Surgical History:  Procedure Laterality Date   BIOPSY  01/29/2023   Procedure: BIOPSY;  Surgeon: Dolores Frame, MD;  [facility]: [facility];  Service: Gastroenterology;;   COLONOSCOPY N/A 11/29/2015   Procedure: COLONOSCOPY;  Surgeon: Malissa Hippo, MD;  [facility]: [facility];  Service: Endoscopy;  Laterality: N/A;  930   COLONOSCOPY WITH PROPOFOL N/A 01/29/2023   Procedure: COLONOSCOPY WITH PROPOFOL;  Surgeon: Dolores Frame, MD;  [facility];  Service: Gastroenterology;  Laterality: N/A;  1:30PM;ASA 1-2   MASS EXCISION Right 06/05/2017   Procedure: EXCISION SUBCUTANEOUS MASS RIGHT FLANK 8CM;  Surgeon: Franky Macho, MD;  Location: [facility];  Service: General;  Laterality: Right;   NO PAST SURGERIES     POLYPECTOMY  01/29/2023   Procedure: POLYPECTOMY;  Surgeon: Dolores Frame, MD;  [facility]: [facility];  Service: Gastroenterology;;   Patient Active Problem List   Diagnosis Date Noted   Bilateral chronic knee pain 02/10/2023   History of colon polyps 01/29/2023   Lipoma of torso     PCP: Elfredia Nevins MD  REFERRING PROVIDER: Persons, [place], [place]  REFERRING DIAG: [phone] (ICD-10-CM) - Acute pain of both knees  THERAPY DIAG:  Pain in both knees, unspecified  chronicity  Muscle weakness (generalized)  Other abnormalities of gait and mobility  Other symptoms and signs involving the musculoskeletal system  Rationale for Evaluation and Treatment: Rehabilitation  ONSET DATE: about a year   SUBJECTIVE:   SUBJECTIVE STATEMENT: Pt states his pain is the normal pain today.  Completing his exercises without issues.   Evaluation: Patient states he hurt his knee about a year ago when stepping up on a step. L Knee popped and had immediate pain. Symptoms have been off and on since. R knee started bothering him from hobbling due to L knee. About a month ago when he went to [place], he had burning pain on inner aspect of knee when walking in parking lot. He has been taking anti inflammatory which has helped.   PERTINENT HISTORY: HTN PAIN:  Are you having pain? Yes: NPRS scale: 1/10 Pain location: R knee  Pain description: burning Aggravating factors: prolonged positioning in sitting Relieving factors: movement  PRECAUTIONS: None  WEIGHT BEARING RESTRICTIONS: No  FALLS:  Has patient fallen in last 6 months? No  OCCUPATION: HVAC tech  PLOF: Independent  PATIENT GOALS: learn stuff to strengthen legs   OBJECTIVE:   DIAGNOSTIC FINDINGS: 03/16/23 XR "3 views left knee were taken today.  Overall well-maintained alignment.   He does have some slight narrowing and sclerotic changes in the medial  compartment no acute changes "  XR "Three-view radiographs of the right knee were taken today.  Overall  well-maintained alignment.  Some early sclerotic changes and joint space  narrowing "  COGNITION: Overall cognitive status: Within functional limits for tasks assessed     SENSATION: WFL  PALPATION: No TTP  LOWER EXTREMITY ROM:  Active ROM Right eval Left eval  Hip flexion    Hip extension    Hip abduction    Hip adduction    Hip internal rotation    Hip external rotation    Knee flexion 127 130  Knee extension 0 0  Ankle dorsiflexion     Ankle plantarflexion    Ankle inversion    Ankle eversion     (Blank rows = not tested)  LOWER EXTREMITY MMT:  MMT Right eval Left eval  Hip flexion 5 5  Hip extension 4+ 4+  Hip abduction 4+ 4+  Hip adduction    Hip internal rotation    Hip external rotation    Knee flexion 5 5  Knee extension 5 5  Ankle dorsiflexion 5 5  Ankle plantarflexion    Ankle inversion    Ankle eversion     (Blank rows = not tested)  FUNCTIONAL TESTS:  STS: without UE support Forward step down test: bilateral eccentric weakness without UE use, mild dynamic knee valgus bilaterally R>L Squat: able to complete deep squat with impaired motor control, weight shift off LLE with increased depth, knee pain bilaterally GAIT: Distance walked: 150 feet  Assistive device utilized: None Level of assistance: Complete Independence Comments: WFL   TODAY'S TREATMENT:                                                                                                                              DATE:  03/30/23 Standing:  heelraises 10X  Hip abduction 10X each  Hip extension 10X each  Mini squats 10X   Lunges onto 4" step 10X each without UE assist  4" step up with power up 10X each  4" lateral step up 10X each  4" forward step down 10X each Sit to stands 10X no UE Nustep level 5 beach; LE only 5 minutes    03/16/23 Squat 1 x 10  Sidelying hip abduction 2 x 10 Prone hip extension 2 x 10 LAQ 10 x 5-10 second holds Step up 7 inch 2 x 10 bilateral      PATIENT EDUCATION:  Education details: Patient educated on exam findings, POC, scope of PT, HEP. Person educated: Patient Education method: Explanation, Demonstration, and Handouts Education comprehension: verbalized understanding, returned demonstration, verbal cues required, and tactile cues required  HOME EXERCISE PROGRAM: Access Code: [email or web address] URL: [email or web address]  Date: 03/16/2023 - Squat  - 1 x daily - 7 x weekly - 2 sets  - 10 reps - Sidelying Hip Abduction  - 1 x daily - 7 x weekly - 2 sets - 10 reps - Prone Hip Extension  - 1 x  daily - 7 x weekly - 2 sets - 10 reps - Seated Long Arc Quad  - 3 x daily - 7 x weekly - 1 sets - 10 reps - 5-10 second hold - Step Up  - 1 x daily - 7 x weekly - 2 sets - 10 reps  ASSESSMENT:  CLINICAL IMPRESSION: Reviewed goals and POC moving forward.  Pt able to recall HEP and complete all without cues with exception of squats.  Cues to keep weight shifted into heels and only go to comfort.    Added standing hip strengthening and progressed with lunges and step up activities reducing UE assist where able.   Finished on nustep for activity tolerance and joint mobility.  No pain or issues voiced throughout session. Unable to update HEP today due to system being down. Patient will benefit from skilled physical therapy in order to improve function and reduce impairment.  OBJECTIVE IMPAIRMENTS: Abnormal gait, decreased activity tolerance, decreased balance, decreased endurance, decreased mobility, difficulty walking, decreased ROM, decreased strength, increased muscle spasms, impaired flexibility, improper body mechanics, and pain.   ACTIVITY LIMITATIONS: carrying, lifting, bending, standing, squatting, stairs, transfers, locomotion level, and caring for others  PARTICIPATION LIMITATIONS: meal prep, cleaning, laundry, shopping, community activity, occupation, and yard work  PERSONAL FACTORS: Time since onset of injury/illness/exacerbation and 1-2 comorbidities: HTN, chronic knee pain  are also affecting patient's functional outcome.   REHAB POTENTIAL: Good  CLINICAL DECISION MAKING: Stable/uncomplicated  EVALUATION COMPLEXITY: Low   GOALS: Goals reviewed with patient? Yes  SHORT TERM GOALS: Target date: 03/30/2023    Patient will be independent with HEP in order to improve functional outcomes. Baseline: Goal status: IN PROGRESS  2.  Patient will report at least 25%  improvement in symptoms for improved quality of life. Baseline: Goal status: IN PROGRESS    LONG TERM GOALS: Target date: 04/13/2023    Patient will report at least 75% improvement in symptoms for improved quality of life. Baseline:  Goal status: IN PROGRESS  2.   Patient will demonstrate grade of 5/5 MMT grade in all tested musculature as evidence of improved strength to assist with stair ambulation and gait.   Baseline: see above Goal status: IN PROGRESS  3.  Patient will be able to complete forward step down test without compensation in order to demonstrate improved LE strength. Baseline: see above Goal status: IN PROGRESS     PLAN:  PT FREQUENCY: 1x/week  PT DURATION: 4 weeks  PLANNED INTERVENTIONS: Therapeutic exercises, Therapeutic activity, Neuromuscular re-education, Balance training, Gait training, Patient/Family education, Joint manipulation, Joint mobilization, Stair training, Orthotic/Fit training, DME instructions, Aquatic Therapy, Dry Needling, Electrical stimulation, Spinal manipulation, Spinal mobilization, Cryotherapy, Moist heat, Compression bandaging, scar mobilization, Splintting, Taping, Traction, Ultrasound, Ionotophoresis 4mg /ml Dexamethasone, and Manual therapy  PLAN FOR NEXT SESSION: Progress HEP.  Continue to increase functional and quad strength.  Update HEP next session to include sit to stands and standing hip exercises (medbridge was down).    Emeline Gins B, PTA 03/30/2023, 7:44 AM

## 2023-04-06 ENCOUNTER — Ambulatory Visit (HOSPITAL_COMMUNITY): Payer: BC Managed Care – PPO | Admitting: Physical Therapy

## 2023-04-06 DIAGNOSIS — M6281 Muscle weakness (generalized): Secondary | ICD-10-CM

## 2023-04-06 DIAGNOSIS — R2689 Other abnormalities of gait and mobility: Secondary | ICD-10-CM | POA: Diagnosis not present

## 2023-04-06 DIAGNOSIS — M25561 Pain in right knee: Secondary | ICD-10-CM | POA: Diagnosis not present

## 2023-04-06 DIAGNOSIS — R29898 Other symptoms and signs involving the musculoskeletal system: Secondary | ICD-10-CM | POA: Diagnosis not present

## 2023-04-06 DIAGNOSIS — M25562 Pain in left knee: Secondary | ICD-10-CM | POA: Diagnosis not present

## 2023-04-06 NOTE — Therapy (Signed)
OUTPATIENT PHYSICAL THERAPY TREATMENT   Patient Name: Kurt Powell MRN: [id number] DOB:[birth year], 62 y.o., male Today's Date: 04/06/2023  END OF SESSION:  PT End of Session - 04/06/23 0737     Visit Number 3    Number of Visits 4    Date for PT Re-Evaluation 04/13/23    Authorization Type BCBS (no auth, VL 30)    Authorization - Number of Visits 30    PT Start Time [phone]    PT Stop Time 0815    PT Time Calculation (min) 39 min    Activity Tolerance Patient tolerated treatment well    Behavior During Therapy [facility] for tasks assessed/performed              Past Medical History:  Diagnosis Date   Hypertension    Sleep apnea    Past Surgical History:  Procedure Laterality Date   BIOPSY  01/29/2023   Procedure: BIOPSY;  Surgeon: Dolores Frame, MD;  [facility];  Service: Gastroenterology;;   COLONOSCOPY N/A 11/29/2015   Procedure: COLONOSCOPY;  Surgeon: Malissa Hippo, MD;  [facility];  Service: Endoscopy;  Laterality: N/A;  930   COLONOSCOPY WITH PROPOFOL N/A 01/29/2023   Procedure: COLONOSCOPY WITH PROPOFOL;  Surgeon: Dolores Frame, MD;  [facility];  Service: Gastroenterology;  Laterality: N/A;  1:30PM;ASA 1-2   MASS EXCISION Right 06/05/2017   Procedure: EXCISION SUBCUTANEOUS MASS RIGHT FLANK 8CM;  Surgeon: Franky Macho, MD;  Location: [facility];  Service: General;  Laterality: Right;   NO PAST SURGERIES     POLYPECTOMY  01/29/2023   Procedure: POLYPECTOMY;  Surgeon: Dolores Frame, MD;  [facility];  Service: Gastroenterology;;   Patient Active Problem List   Diagnosis Date Noted   Bilateral chronic knee pain 02/10/2023   History of colon polyps 01/29/2023   Lipoma of torso     PCP: Elfredia Nevins MD  REFERRING PROVIDER: Persons, [place], [place]  REFERRING DIAG: [phone] (ICD-10-CM) - Acute pain of both knees  THERAPY DIAG:  Pain in both knees, unspecified  chronicity  Muscle weakness (generalized)  Other abnormalities of gait and mobility  Rationale for Evaluation and Treatment: Rehabilitation  ONSET DATE: about a year   SUBJECTIVE:   SUBJECTIVE STATEMENT: Pt states "no worse no better".  Compliance with HEP.   Evaluation: Patient states he hurt his knee about a year ago when stepping up on a step. L Knee popped and had immediate pain. Symptoms have been off and on since. R knee started bothering him from hobbling due to L knee. About a month ago when he went to [place], he had burning pain on inner aspect of knee when walking in parking lot. He has been taking anti inflammatory which has helped.   PERTINENT HISTORY: HTN PAIN:  Are you having pain? Yes: NPRS scale: 1/10 Pain location: R knee  Pain description: burning Aggravating factors: prolonged positioning in sitting Relieving factors: movement  PRECAUTIONS: None  WEIGHT BEARING RESTRICTIONS: No  FALLS:  Has patient fallen in last 6 months? No  OCCUPATION: HVAC tech  PLOF: Independent  PATIENT GOALS: learn stuff to strengthen legs   OBJECTIVE:   DIAGNOSTIC FINDINGS: 03/16/23 XR "3 views left knee were taken today.  Overall well-maintained alignment.   He does have some slight narrowing and sclerotic changes in the medial  compartment no acute changes " XR "Three-view radiographs of the right knee were taken today.  Overall  well-maintained  alignment.  Some early sclerotic changes and joint space  narrowing "  COGNITION: Overall cognitive status: Within functional limits for tasks assessed     SENSATION: WFL  PALPATION: No TTP  LOWER EXTREMITY ROM:  Active ROM Right eval Left eval  Hip flexion    Hip extension    Hip abduction    Hip adduction    Hip internal rotation    Hip external rotation    Knee flexion 127 130  Knee extension 0 0  Ankle dorsiflexion    Ankle plantarflexion    Ankle inversion    Ankle eversion     (Blank rows = not  tested)  LOWER EXTREMITY MMT:  MMT Right eval Left eval  Hip flexion 5 5  Hip extension 4+ 4+  Hip abduction 4+ 4+  Hip adduction    Hip internal rotation    Hip external rotation    Knee flexion 5 5  Knee extension 5 5  Ankle dorsiflexion 5 5  Ankle plantarflexion    Ankle inversion    Ankle eversion     (Blank rows = not tested)  FUNCTIONAL TESTS:  STS: without UE support Forward step down test: bilateral eccentric weakness without UE use, mild dynamic knee valgus bilaterally R>L Squat: able to complete deep squat with impaired motor control, weight shift off LLE with increased depth, knee pain bilaterally GAIT: Distance walked: 150 feet  Assistive device utilized: None Level of assistance: Complete Independence Comments: WFL   TODAY'S TREATMENT:                                                                                                                              DATE:  04/06/23 Standing:  heelraises 20X  Hip abduction 10X2 each  Hip extension 10X 2each  Mini squats 10X2   Wall sits 10X10" holds  Lunges onto 4" step 10X each without UE assist  6" step up with power up 10X each  6" lateral step up 10X each  4" forward step down 10X each Sit to stands 10X no UE Nustep level 5 beach; LE only 5 minutes  03/30/23 Standing:  heelraises 10X  Hip abduction 10X each  Hip extension 10X each  Mini squats 10X   Lunges onto 4" step 10X each without UE assist  4" step up with power up 10X each  4" lateral step up 10X each  4" forward step down 10X each Sit to stands 10X no UE Nustep level 5 beach; LE only 5 minutes    03/16/23 Squat 1 x 10  Sidelying hip abduction 2 x 10 Prone hip extension 2 x 10 LAQ 10 x 5-10 second holds Step up 7 inch 2 x 10 bilateral      PATIENT EDUCATION:  Education details: Patient educated on exam findings, POC, scope of PT, HEP. Person educated: Patient Education method: Explanation, Demonstration, and Handouts Education  comprehension: verbalized understanding, returned demonstration, verbal cues required, and  tactile cues required  HOME EXERCISE PROGRAM: Access Code: [email or web address] URL: [email or web address]  Date: 03/16/2023 - Squat  - 1 x daily - 7 x weekly - 2 sets - 10 reps - Sidelying Hip Abduction  - 1 x daily - 7 x weekly - 2 sets - 10 reps - Prone Hip Extension  - 1 x daily - 7 x weekly - 2 sets - 10 reps - Seated Long Arc Quad  - 3 x daily - 7 x weekly - 1 sets - 10 reps - 5-10 second hold - Step Up  - 1 x daily - 7 x weekly - 2 sets - 10 reps  Date: 04/06/2023 Prepared by: Emeline Gins - Standing Hip Abduction  - 2 x daily - 7 x weekly - 1 sets - 10 reps - Standing Hip Extension  - 2 x daily - 7 x weekly - 1 sets - 10 reps - Mini Squat with Chair  - 2 x daily - 7 x weekly - 1 sets - 10 reps - Sit to Stand Without Arm Support  - 2 x daily - 7 x weekly - 1 sets - 10 reps - Wall Sit  - 2 x daily - 7 x weekly - 1 sets - 10 reps - 10 sec hold  ASSESSMENT:  CLINICAL IMPRESSION: Continued with exercise progression focusing on bil quad strength.  Increased to 2 sets of established repetitions.  Much improved form with squats without cues needed for form.  Progressed to wall sitting with visible fatigue of quads at 8 seconds.  Updated HEP to include standing strengthening added last session (medbridge was down). Overall good form with minimal cues and no c/o pain. Finished on nustep for activity tolerance and joint mobility.  Patient will benefit from skilled physical therapy in order to improve function and reduce impairment.  OBJECTIVE IMPAIRMENTS: Abnormal gait, decreased activity tolerance, decreased balance, decreased endurance, decreased mobility, difficulty walking, decreased ROM, decreased strength, increased muscle spasms, impaired flexibility, improper body mechanics, and pain.   ACTIVITY LIMITATIONS: carrying, lifting, bending, standing, squatting, stairs, transfers, locomotion level,  and caring for others  PARTICIPATION LIMITATIONS: meal prep, cleaning, laundry, shopping, community activity, occupation, and yard work  PERSONAL FACTORS: Time since onset of injury/illness/exacerbation and 1-2 comorbidities: HTN, chronic knee pain  are also affecting patient's functional outcome.   REHAB POTENTIAL: Good  CLINICAL DECISION MAKING: Stable/uncomplicated  EVALUATION COMPLEXITY: Low   GOALS: Goals reviewed with patient? Yes  SHORT TERM GOALS: Target date: 03/30/2023    Patient will be independent with HEP in order to improve functional outcomes. Baseline: Goal status: IN PROGRESS  2.  Patient will report at least 25% improvement in symptoms for improved quality of life. Baseline: Goal status: IN PROGRESS    LONG TERM GOALS: Target date: 04/13/2023    Patient will report at least 75% improvement in symptoms for improved quality of life. Baseline:  Goal status: IN PROGRESS  2.   Patient will demonstrate grade of 5/5 MMT grade in all tested musculature as evidence of improved strength to assist with stair ambulation and gait.   Baseline: see above Goal status: IN PROGRESS  3.  Patient will be able to complete forward step down test without compensation in order to demonstrate improved LE strength. Baseline: see above Goal status: IN PROGRESS     PLAN:  PT FREQUENCY: 1x/week  PT DURATION: 4 weeks  PLANNED INTERVENTIONS: Therapeutic exercises, Therapeutic activity, Neuromuscular re-education, Balance training, Gait training, Patient/Family education, Joint manipulation, Joint  mobilization, Stair training, Orthotic/Fit training, DME instructions, Aquatic Therapy, Dry Needling, Electrical stimulation, Spinal manipulation, Spinal mobilization, Cryotherapy, Moist heat, Compression bandaging, scar mobilization, Splintting, Taping, Traction, Ultrasound, Ionotophoresis 4mg /ml Dexamethasone, and Manual therapy  PLAN FOR NEXT SESSION: Progress HEP.  Continue to  increase functional and quad strength.  Reassess next session.  Emeline Gins B, PTA 04/06/2023, 7:39 AM

## 2023-04-15 ENCOUNTER — Ambulatory Visit (HOSPITAL_COMMUNITY): Payer: BC Managed Care – PPO | Attending: Physician Assistant

## 2023-04-15 DIAGNOSIS — M25561 Pain in right knee: Secondary | ICD-10-CM | POA: Insufficient documentation

## 2023-04-15 DIAGNOSIS — M25562 Pain in left knee: Secondary | ICD-10-CM | POA: Insufficient documentation

## 2023-04-15 DIAGNOSIS — M6281 Muscle weakness (generalized): Secondary | ICD-10-CM | POA: Diagnosis not present

## 2023-04-15 DIAGNOSIS — R29898 Other symptoms and signs involving the musculoskeletal system: Secondary | ICD-10-CM | POA: Insufficient documentation

## 2023-04-15 DIAGNOSIS — R2689 Other abnormalities of gait and mobility: Secondary | ICD-10-CM | POA: Diagnosis not present

## 2023-04-15 NOTE — Therapy (Signed)
OUTPATIENT PHYSICAL THERAPY TREATMENT/DISCHARGE NOTE PHYSICAL THERAPY DISCHARGE SUMMARY  Visits from Start of Care: 4  Current functional level related to goals / functional outcomes: See below   Remaining deficits: See below   Education / Equipment: HEP   Patient agrees to discharge. Patient goals were partially met. Patient is being discharged due to being pleased with the current functional level.    Patient Name: Kurt Powell MRN: [id number] DOB:1961/05/31, 62 y.o., male Today's Date: 04/15/2023  END OF SESSION:  PT End of Session - 04/15/23 0740     Visit Number 4    Number of Visits 4    Date for PT Re-Evaluation 04/13/23    Authorization Type [employer] (no auth, VL 30)    Authorization - Number of Visits 30    PT Start Time [phone]    PT Stop Time 0759    PT Time Calculation (min) 26 min    Activity Tolerance Patient tolerated treatment well    Behavior During Therapy [facility] for tasks assessed/performed              Past Medical History:  Diagnosis Date   Hypertension    Sleep apnea    Past Surgical History:  Procedure Laterality Date   BIOPSY  01/29/2023   Procedure: BIOPSY;  Surgeon: Dolores Frame, MD;  [facility];  Service: Gastroenterology;;   COLONOSCOPY N/A 11/29/2015   Procedure: COLONOSCOPY;  Surgeon: Malissa Hippo, MD;  [facility];  Service: Endoscopy;  Laterality: N/A;  930   COLONOSCOPY WITH PROPOFOL N/A 01/29/2023   Procedure: COLONOSCOPY WITH PROPOFOL;  Surgeon: Dolores Frame, MD;  [facility];  Service: Gastroenterology;  Laterality: N/A;  1:30PM;ASA 1-2   MASS EXCISION Right 06/05/2017   Procedure: EXCISION SUBCUTANEOUS MASS RIGHT FLANK 8CM;  Surgeon: Franky Macho, MD;  Location: [facility];  Service: General;  Laterality: Right;   NO PAST SURGERIES     POLYPECTOMY  01/29/2023   Procedure: POLYPECTOMY;  Surgeon: Dolores Frame, MD;  [facility];  Service:  Gastroenterology;;   Patient Active Problem List   Diagnosis Date Noted   Bilateral chronic knee pain 02/10/2023   History of colon polyps 01/29/2023   Lipoma of torso     PCP: Elfredia Nevins MD  REFERRING PROVIDER: Persons, [place], [place]  REFERRING DIAG: [phone] (ICD-10-CM) - Acute pain of both knees  THERAPY DIAG:  Pain in both knees, unspecified chronicity  Muscle weakness (generalized)  Other abnormalities of gait and mobility  Other symptoms and signs involving the musculoskeletal system  Rationale for Evaluation and Treatment: Rehabilitation  ONSET DATE: about a year   SUBJECTIVE:   SUBJECTIVE STATEMENT: Patient reports 25% improvement overall; still having stiffness in his knees after sitting for a little while  Evaluation: Patient states he hurt his knee about a year ago when stepping up on a step. L Knee popped and had immediate pain. Symptoms have been off and on since. R knee started bothering him from hobbling due to L knee. About a month ago when he went to [place], he had burning pain on inner aspect of knee when walking in parking lot. He has been taking anti inflammatory which has helped.   PERTINENT HISTORY: HTN PAIN:  Are you having pain? Yes: NPRS scale: 1/10 Pain location: R knee  Pain description: burning Aggravating factors: prolonged positioning in sitting Relieving factors: movement  PRECAUTIONS: None  WEIGHT BEARING RESTRICTIONS: No  FALLS:  Has  patient fallen in last 6 months? No  OCCUPATION: HVAC tech  PLOF: Independent  PATIENT GOALS: learn stuff to strengthen legs   OBJECTIVE:   DIAGNOSTIC FINDINGS: 03/16/23 XR "3 views left knee were taken today.  Overall well-maintained alignment.   He does have some slight narrowing and sclerotic changes in the medial  compartment no acute changes " XR "Three-view radiographs of the right knee were taken today.  Overall  well-maintained alignment.  Some early sclerotic changes and joint  space  narrowing "  COGNITION: Overall cognitive status: Within functional limits for tasks assessed     SENSATION: WFL  PALPATION: No TTP  LOWER EXTREMITY ROM:  Active ROM Right eval Left eval  Hip flexion    Hip extension    Hip abduction    Hip adduction    Hip internal rotation    Hip external rotation    Knee flexion 127 130  Knee extension 0 0  Ankle dorsiflexion    Ankle plantarflexion    Ankle inversion    Ankle eversion     (Blank rows = not tested)  LOWER EXTREMITY MMT:  MMT Right eval Left eval Right 04/15/23 Left 04/15/23  Hip flexion 5 5 5 5   Hip extension 4+ 4+ 5 5  Hip abduction 4+ 4+ 5 5  Hip adduction      Hip internal rotation      Hip external rotation      Knee flexion 5 5 5 5   Knee extension 5 5 5 5   Ankle dorsiflexion 5 5 5 5   Ankle plantarflexion      Ankle inversion      Ankle eversion       (Blank rows = not tested)  FUNCTIONAL TESTS:  STS: without UE support Forward step down test: bilateral eccentric weakness without UE use, mild dynamic knee valgus bilaterally R>L Squat: able to complete deep squat with impaired motor control, weight shift off LLE with increased depth, knee pain bilaterally GAIT: Distance walked: 150 feet  Assistive device utilized: None Level of assistance: Complete Independence Comments: WFL   TODAY'S TREATMENT:                                                                                                                              DATE:  04/15/23 Nustep x 5' dynamic warm up Progress note 5 time sit to stand 14.82 no hands MMT's and AROM Review of goals     04/06/23 Standing:  heelraises 20X  Hip abduction 10X2 each  Hip extension 10X 2each  Mini squats 10X2   Wall sits 10X10" holds  Lunges onto 4" step 10X each without UE assist  6" step up with power up 10X each  6" lateral step up 10X each  4" forward step down 10X each Sit to stands 10X no UE Nustep level 5 beach; LE only 5  minutes  03/30/23 Standing:  heelraises 10X  Hip abduction 10X each  Hip extension 10X each  Mini squats 10X   Lunges onto 4" step 10X each without UE assist  4" step up with power up 10X each  4" lateral step up 10X each  4" forward step down 10X each Sit to stands 10X no UE Nustep level 5 beach; LE only 5 minutes    03/16/23 Squat 1 x 10  Sidelying hip abduction 2 x 10 Prone hip extension 2 x 10 LAQ 10 x 5-10 second holds Step up 7 inch 2 x 10 bilateral      PATIENT EDUCATION:  Education details: Patient educated on exam findings, POC, scope of PT, HEP. Person educated: Patient Education method: Explanation, Demonstration, and Handouts Education comprehension: verbalized understanding, returned demonstration, verbal cues required, and tactile cues required  HOME EXERCISE PROGRAM: Access Code: [email or web address] URL: [email or web address]  Date: 03/16/2023 - Squat  - 1 x daily - 7 x weekly - 2 sets - 10 reps - Sidelying Hip Abduction  - 1 x daily - 7 x weekly - 2 sets - 10 reps - Prone Hip Extension  - 1 x daily - 7 x weekly - 2 sets - 10 reps - Seated Long Arc Quad  - 3 x daily - 7 x weekly - 1 sets - 10 reps - 5-10 second hold - Step Up  - 1 x daily - 7 x weekly - 2 sets - 10 reps  Date: 04/06/2023 Prepared by: Emeline Gins - Standing Hip Abduction  - 2 x daily - 7 x weekly - 1 sets - 10 reps - Standing Hip Extension  - 2 x daily - 7 x weekly - 1 sets - 10 reps - Mini Squat with Chair  - 2 x daily - 7 x weekly - 1 sets - 10 reps - Sit to Stand Without Arm Support  - 2 x daily - 7 x weekly - 1 sets - 10 reps - Wall Sit  - 2 x daily - 7 x weekly - 1 sets - 10 reps - 10 sec hold  ASSESSMENT:  CLINICAL IMPRESSION: Progress note today; patient has met 4/5 set rehab goals and is agreeable to discharge at this time.     OBJECTIVE IMPAIRMENTS: Abnormal gait, decreased activity tolerance, decreased balance, decreased endurance, decreased mobility, difficulty walking,  decreased ROM, decreased strength, increased muscle spasms, impaired flexibility, improper body mechanics, and pain.   ACTIVITY LIMITATIONS: carrying, lifting, bending, standing, squatting, stairs, transfers, locomotion level, and caring for others  PARTICIPATION LIMITATIONS: meal prep, cleaning, laundry, shopping, community activity, occupation, and yard work  PERSONAL FACTORS: Time since onset of injury/illness/exacerbation and 1-2 comorbidities: HTN, chronic knee pain  are also affecting patient's functional outcome.   REHAB POTENTIAL: Good  CLINICAL DECISION MAKING: Stable/uncomplicated  EVALUATION COMPLEXITY: Low   GOALS: Goals reviewed with patient? Yes  SHORT TERM GOALS: Target date: 03/30/2023    Patient will be independent with HEP in order to improve functional outcomes. Baseline: Goal status: met  2.  Patient will report at least 25% improvement in symptoms for improved quality of life. Baseline: Goal status: met    LONG TERM GOALS: Target date: 04/13/2023    Patient will report at least 75% improvement in symptoms for improved quality of life. Baseline:  Goal status: IN PROGRESS  2.   Patient will demonstrate grade of 5/5 MMT grade in all tested musculature as evidence of improved strength to assist with stair ambulation and gait.   Baseline: see above Goal status: met  3.  Patient will be able to complete forward step down test without compensation in order to demonstrate improved LE strength. Baseline: see above Goal status: met     PLAN:  PT FREQUENCY: 1x/week  PT DURATION: 4 weeks  PLANNED INTERVENTIONS: Therapeutic exercises, Therapeutic activity, Neuromuscular re-education, Balance training, Gait training, Patient/Family education, Joint manipulation, Joint mobilization, Stair training, Orthotic/Fit training, DME instructions, Aquatic Therapy, Dry Needling, Electrical stimulation, Spinal manipulation, Spinal mobilization, Cryotherapy, Moist heat,  Compression bandaging, scar mobilization, Splintting, Taping, Traction, Ultrasound, Ionotophoresis 4mg /ml Dexamethasone, and Manual therapy  PLAN FOR NEXT SESSION: discharge  8:00 AM, 04/15/23 Jarelly Rinck Small Lareina Espino MPT [facility] [place] [phone] Ph:[phone]

## 2023-04-20 ENCOUNTER — Encounter (HOSPITAL_COMMUNITY): Payer: BC Managed Care – PPO

## 2023-05-25 DIAGNOSIS — Z23 Encounter for immunization: Secondary | ICD-10-CM | POA: Diagnosis not present

## 2023-05-25 DIAGNOSIS — H6121 Impacted cerumen, right ear: Secondary | ICD-10-CM | POA: Diagnosis not present

## 2023-05-25 DIAGNOSIS — K219 Gastro-esophageal reflux disease without esophagitis: Secondary | ICD-10-CM | POA: Diagnosis not present

## 2023-05-25 DIAGNOSIS — Z6833 Body mass index (BMI) 33.0-33.9, adult: Secondary | ICD-10-CM | POA: Diagnosis not present

## 2023-05-25 DIAGNOSIS — I10 Essential (primary) hypertension: Secondary | ICD-10-CM | POA: Diagnosis not present

## 2023-05-25 DIAGNOSIS — Z1331 Encounter for screening for depression: Secondary | ICD-10-CM | POA: Diagnosis not present

## 2023-05-25 DIAGNOSIS — Z0001 Encounter for general adult medical examination with abnormal findings: Secondary | ICD-10-CM | POA: Diagnosis not present

## 2023-05-25 DIAGNOSIS — R7303 Prediabetes: Secondary | ICD-10-CM | POA: Diagnosis not present

## 2023-05-25 DIAGNOSIS — E6609 Other obesity due to excess calories: Secondary | ICD-10-CM | POA: Diagnosis not present

## 2023-05-26 DIAGNOSIS — R7303 Prediabetes: Secondary | ICD-10-CM | POA: Diagnosis not present

## 2023-05-26 DIAGNOSIS — D225 Melanocytic nevi of trunk: Secondary | ICD-10-CM | POA: Diagnosis not present

## 2023-05-26 DIAGNOSIS — Z0001 Encounter for general adult medical examination with abnormal findings: Secondary | ICD-10-CM | POA: Diagnosis not present

## 2023-05-26 DIAGNOSIS — E782 Mixed hyperlipidemia: Secondary | ICD-10-CM | POA: Diagnosis not present

## 2023-05-26 DIAGNOSIS — B078 Other viral warts: Secondary | ICD-10-CM | POA: Diagnosis not present

## 2023-05-26 DIAGNOSIS — Z125 Encounter for screening for malignant neoplasm of prostate: Secondary | ICD-10-CM | POA: Diagnosis not present

## 2023-05-26 DIAGNOSIS — Z111 Encounter for screening for respiratory tuberculosis: Secondary | ICD-10-CM | POA: Diagnosis not present

## 2023-05-26 DIAGNOSIS — I10 Essential (primary) hypertension: Secondary | ICD-10-CM | POA: Diagnosis not present

## 2023-05-26 DIAGNOSIS — L821 Other seborrheic keratosis: Secondary | ICD-10-CM | POA: Diagnosis not present

## 2023-07-15 DIAGNOSIS — N2581 Secondary hyperparathyroidism of renal origin: Secondary | ICD-10-CM | POA: Diagnosis not present

## 2023-07-15 DIAGNOSIS — E559 Vitamin D deficiency, unspecified: Secondary | ICD-10-CM | POA: Diagnosis not present

## 2023-07-15 DIAGNOSIS — N182 Chronic kidney disease, stage 2 (mild): Secondary | ICD-10-CM | POA: Diagnosis not present

## 2023-07-15 DIAGNOSIS — R7303 Prediabetes: Secondary | ICD-10-CM | POA: Diagnosis not present

## 2023-07-15 DIAGNOSIS — I129 Hypertensive chronic kidney disease with stage 1 through stage 4 chronic kidney disease, or unspecified chronic kidney disease: Secondary | ICD-10-CM | POA: Diagnosis not present

## 2023-07-15 DIAGNOSIS — N189 Chronic kidney disease, unspecified: Secondary | ICD-10-CM | POA: Diagnosis not present

## 2023-07-15 DIAGNOSIS — Z6834 Body mass index (BMI) 34.0-34.9, adult: Secondary | ICD-10-CM | POA: Diagnosis not present

## 2023-07-22 DIAGNOSIS — N182 Chronic kidney disease, stage 2 (mild): Secondary | ICD-10-CM | POA: Diagnosis not present

## 2023-07-22 DIAGNOSIS — I129 Hypertensive chronic kidney disease with stage 1 through stage 4 chronic kidney disease, or unspecified chronic kidney disease: Secondary | ICD-10-CM | POA: Diagnosis not present

## 2023-07-22 DIAGNOSIS — N2581 Secondary hyperparathyroidism of renal origin: Secondary | ICD-10-CM | POA: Diagnosis not present

## 2023-07-22 DIAGNOSIS — R7303 Prediabetes: Secondary | ICD-10-CM | POA: Diagnosis not present

## 2023-12-14 NOTE — Progress Notes (Signed)
 12/15/2023 6:43 PM   Kurt Powell 07-Jul-1961 [id number]  Referring provider: Kathyleen Parkins, MD [address] [place],  [place] [address]  No chief complaint on file.   HPI: 63 year old male sent by [facility] for evaluation and management of elevated PSA.  PSA data is as follows:  03/2020--2.3 05/2022--5.41 05/2023--3.0  He has been seen here before, the last by myself and November, 2023.  At that time, PSA data was lacking, but apparently the patient thought a recent PSA at that time was 5.2/5.3.  Baseline PSA in 2011 was 0.95.  Family history of prostate cancer in her brother.  He does have BPH with lower urinary tract symptoms.  They are not bad enough for him to want to start a medication.  IPSS 15/2  He does have VED and would like to consider therapy PMH: Past Medical History:  Diagnosis Date   Hypertension    Sleep apnea     Surgical History: Past Surgical History:  Procedure Laterality Date   BIOPSY  01/29/2023   Procedure: BIOPSY;  Surgeon: Urban Garden, MD;  [facility];  Service: Gastroenterology;;   COLONOSCOPY N/A 11/29/2015   Procedure: COLONOSCOPY;  Surgeon: Ruby Corporal, MD;  [facility];  Service: Endoscopy;  Laterality: N/A;  930   COLONOSCOPY WITH PROPOFOL  N/A 01/29/2023   Procedure: COLONOSCOPY WITH PROPOFOL ;  Surgeon: Urban Garden, MD;  [facility];  Service: Gastroenterology;  Laterality: N/A;  1:30PM;ASA 1-2   MASS EXCISION Right 06/05/2017   Procedure: EXCISION SUBCUTANEOUS MASS RIGHT FLANK 8CM;  Surgeon: Alanda Allegra, MD;  Location: [facility];  Service: General;  Laterality: Right;   NO PAST SURGERIES     POLYPECTOMY  01/29/2023   Procedure: POLYPECTOMY;  Surgeon: Urban Garden, MD;  [facility];  Service: Gastroenterology;;    Home Medications:  Allergies as of 12/15/2023   No Known Allergies      Medication List         Accurate as of Dec 14, 2023  6:43 PM. If you have any questions, ask your nurse or doctor.          amLODipine-olmesartan 5-20 MG tablet Commonly known as: AZOR Take 1 tablet by mouth daily.   ascorbic acid 500 MG tablet Commonly known as: VITAMIN C Take 500 mg by mouth daily.   Cholecalciferol 75 MCG (3000 UT) Tabs Take 3,000 Units by mouth daily.   Cinnamon 500 MG Tabs Take 500 mg by mouth daily.   Fish Oil 500 MG Caps Take 500 mg by mouth daily.   GARLIC PO Take 1 capsule by mouth daily.   meloxicam  15 MG tablet Commonly known as: Mobic  Take 1 tablet (15 mg total) by mouth daily.        Allergies: No Known Allergies  Family History: No family history on file.  Social History:  reports that he quit smoking about 41 years ago. His smoking use included cigarettes. He has never used smokeless tobacco. He reports that he does not drink alcohol and does not use drugs.  ROS: All other review of systems were reviewed and are negative except what is noted above in HPI  Physical Exam: There were no vitals taken for this visit.  Constitutional:  Alert and oriented, No acute distress. HEENT: [place] AT, moist mucus membranes.  Trachea midline, no masses. Cardiovascular: No clubbing, cyanosis, or edema. Respiratory: Normal respiratory effort, no increased work of breathing. GI: No inguinal  hernias GU: Normal phallus. No masses/lesions on penis, testis, scrotum. Prostate 50 g smooth no nodules no induration.  Lymph: No cervical or inguinal lymphadenopathy. Skin: No rashes, bruises or suspicious lesions. Neurologic: Grossly intact, no focal deficits, moving all 4 extremities. Psychiatric: Normal mood and affect.  Laboratory Data: Lab Results  Component Value Date   WBC 7.7 06/01/2017   HGB 14.1 06/01/2017   HCT 41.2 06/01/2017   MCV 91.6 06/01/2017   PLT 263 06/01/2017    Lab Results  Component Value Date   CREATININE 1.20 04/13/2019    Urinalysis    Component  Value Date/Time   APPEARANCEUR Clear 07/01/2022 1118   GLUCOSEU Negative 07/01/2022 1118   BILIRUBINUR Negative 07/01/2022 1118   PROTEINUR Negative 07/01/2022 1118   NITRITE Negative 07/01/2022 1118   LEUKOCYTESUR Negative 07/01/2022 1118    Lab Results  Component Value Date   LABMICR Comment 07/01/2022    Old PSA data from [facility] reviewed  Old records from [facility] reviewed  US  RENAL  Narrative CLINICAL DATA:  Initial evaluation for chronic kidney disease, stage III. History of hypertension, smoking.  EXAM: RENAL / URINARY TRACT ULTRASOUND COMPLETE  COMPARISON:  Prior CT from 04/13/2019.  FINDINGS: Right Kidney:  Renal measurements: 11.8 x 5.5 x 8.1 cm = volume: 271 mL. Echogenicity within normal limits. No mass or hydronephrosis visualized. No nephrolithiasis.  Left Kidney:  Renal measurements: 11.2 x 5.9 x 5.7 cm = volume: 199 mL. Echogenicity within normal limits. No nephrolithiasis. Probable multiple parapelvic cyst noted about the left renal pelvis, grossly similar to previous CT. No definite hydronephrosis or other discrete renal lesion.  Bladder:  Appears normal for degree of bladder distention.  Other:  None.  IMPRESSION: 1. Multiple left-sided parapelvic cysts, similar as compared to recent CT from 04/13/2019. No definite obstructive uropathy. 2. Otherwise unremarkable and normal renal ultrasound.   Electronically Signed By: Virgia Griffins M.D. On: 09/23/2019 13:49   Residual urine volume today 140 mL  Assessment: - Elevated PSA  -Family history of prostate cancer  -BPH w/ LUTS  -Erectile dysfunction  Plan: - I will recheck his PSA today  -He does not want to start medical therapy for BPH  -If persistent upward trend, consider ultrasound and biopsy.  If PSA stable, I will make an appointment to come back in a year  -I sent in a prescription for sildenafil   There are no diagnoses linked to this  encounter.  No follow-ups on file.  Roque Collar, MD  [facility] [place]

## 2023-12-15 ENCOUNTER — Encounter: Payer: Self-pay | Admitting: Urology

## 2023-12-15 ENCOUNTER — Ambulatory Visit: Admitting: Urology

## 2023-12-15 VITALS — BP 145/89 | HR 90

## 2023-12-15 DIAGNOSIS — N5201 Erectile dysfunction due to arterial insufficiency: Secondary | ICD-10-CM

## 2023-12-15 DIAGNOSIS — N401 Enlarged prostate with lower urinary tract symptoms: Secondary | ICD-10-CM | POA: Diagnosis not present

## 2023-12-15 DIAGNOSIS — R972 Elevated prostate specific antigen [PSA]: Secondary | ICD-10-CM

## 2023-12-15 MED ORDER — SILDENAFIL CITRATE 100 MG PO TABS
ORAL_TABLET | ORAL | 99 refills | Status: AC
Start: 2023-12-15 — End: ?

## 2023-12-16 ENCOUNTER — Encounter: Payer: Self-pay | Admitting: Urology

## 2023-12-16 LAB — PSA: Prostate Specific Ag, Serum: 3.1 ng/mL (ref 0.0–4.0)

## 2024-01-12 LAB — LAB REPORT - SCANNED
A1c: 5.8
Albumin, Urine POC: 15.8
Creatinine, POC: 165.7 mg/dL
EGFR: 73
Microalb Creat Ratio: 10

## 2024-02-16 NOTE — Progress Notes (Signed)
 [facility] [address], [place] [address]   Clinic Day:  02/17/2024  Referring physician: Bertell Satterfield, MD  Patient Care Team: Bertell Satterfield, MD as PCP - General (Internal Medicine)   ASSESSMENT & PLAN:   Assessment:  1.  Abnormal CBC: - Patient seen at the request of Dr. Rachele.  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 - 01/11/2024: WBC-5.9, hemoglobin-13.6, MCV-96, PLT-240, NRBC 4%.  CBC prior to that on 07/15/2023 did not report any nucleated RBC. - No B symptoms.  No recurrent infections.  He had a sinus infection after the last blood work on 01/11/2024.  2.  Social/family history: - He quit smoking in 1984.  No chemical exposure. - No family history of leukemia.  Brother had prostate cancer.  Another brother had kidney cancer.  Plan:  1.  Elevated nucleated RBC: - We have discussed that nucleated RBCs are not normally seen in the peripheral blood.  When present, they may indicate massively stimulated erythropoiesis, such as with hemolysis, profound stress or myelophthisic condition (myelofibrosis). - Recommend repeating CBC with differential today.  Will check LDH and direct Coombs test. - His hemoglobin has dropped to 13.6 from baseline around 15-16.  Will check for nutritional deficiencies. - Will also check pan heme NGS panel.  RTC 3 weeks for follow-up.   Orders Placed This Encounter  Procedures   Miscellaneous [facility] test (send-out)    Standing Status:   Future    Number of Occurrences:   1    Expected Date:   02/17/2024    Expiration Date:   05/17/2024    Test name / description::   Pan Heme NGS panel - test code: 547677   CBC with Differential    Standing Status:   Future    Number of Occurrences:   1    Expected Date:   02/17/2024    Expiration Date:   05/17/2024   Reticulocytes    Standing Status:   Future    Number of Occurrences:   1    Expected Date:   02/17/2024    Expiration Date:   05/17/2024   Lactate  dehydrogenase    Standing Status:   Future    Number of Occurrences:   1    Expected Date:   02/17/2024    Expiration Date:   05/17/2024   Iron and TIBC ([facility] ONLY)    Standing Status:   Future    Number of Occurrences:   1    Expected Date:   02/17/2024    Expiration Date:   05/17/2024   Ferritin    Standing Status:   Future    Number of Occurrences:   1    Expected Date:   02/17/2024    Expiration Date:   05/17/2024   Vitamin B12    Standing Status:   Future    Number of Occurrences:   1    Expected Date:   02/17/2024    Expiration Date:   05/17/2024   Folate    Standing Status:   Future    Number of Occurrences:   1    Expected Date:   02/17/2024    Expiration Date:   05/17/2024   Copper , serum    Standing Status:   Future    Number of Occurrences:   1    Expected Date:   02/17/2024    Expiration Date:   05/17/2024   Direct antiglobulin test  Standing Status:   Future    Number of Occurrences:   1    Expected Date:   02/17/2024    Expiration Date:   05/17/2024      LILLETTE Hummingbird R Teague,acting as a scribe for Alean Stands, MD.,have documented all relevant documentation on the behalf of Alean Stands, MD,as directed by  Alean Stands, MD while in the presence of Alean Stands, MD.   I, Alean Stands MD, have reviewed the above documentation for accuracy and completeness, and I agree with the above.   Alean Stands, MD   7/9/202512:21 PM  CHIEF COMPLAINT/PURPOSE OF CONSULT:   Diagnosis: Abnormal CBC  Current Therapy: Under workup  HISTORY OF PRESENT ILLNESS:   Kurt Powell is a 63 y.o. male presenting to clinic today for evaluation of nucleated RBC at the request of Rachele Amour, MD.  Patient has a medical history of CKD, hypertension, secondary hyperparathyroidism, and vitamin D deficiency.  Kurt Powell was seen by Dr. Rachele on 01/18/24 for regular follow-up with labs done prior. CBC from 01/11/24 showed elevated nRBC at 4,  with all other results within the normal range. He has no prior history of nucleated RBC's on CBC's. Renal panel from 01/11/24 was normal.  His most recent colonoscopy was on 01/29/23 which found nine 3-8 mm polyps in the transverse colon, ascending colon and cecum. One 1 mm polyp was found in the ascending colon and three 3-6 mm polyps were found in the rectum and descending colon. There was diverticulosis in the sigmoid colon and descending colon, as well as non-bleeding internal hemorrhoids.   Today, he states that he is doing well overall. His appetite level is at 100%. His energy level is at 90%.  PAST MEDICAL HISTORY:   Past Medical History: Past Medical History:  Diagnosis Date   Hypertension    Sleep apnea     Surgical History: Past Surgical History:  Procedure Laterality Date   BIOPSY  01/29/2023   Procedure: BIOPSY;  Surgeon: Eartha Angelia Sieving, MD;  [facility];  Service: Gastroenterology;;   COLONOSCOPY N/A 11/29/2015   Procedure: COLONOSCOPY;  Surgeon: Claudis RAYMOND Rivet, MD;  [facility];  Service: Endoscopy;  Laterality: N/A;  930   COLONOSCOPY WITH PROPOFOL  N/A 01/29/2023   Procedure: COLONOSCOPY WITH PROPOFOL ;  Surgeon: Eartha Angelia Sieving, MD;  [facility];  Service: Gastroenterology;  Laterality: N/A;  1:30PM;ASA 1-2   MASS EXCISION Right 06/05/2017   Procedure: EXCISION SUBCUTANEOUS MASS RIGHT FLANK 8CM;  Surgeon: Mavis Anes, MD;  Location: [facility];  Service: General;  Laterality: Right;   NO PAST SURGERIES     POLYPECTOMY  01/29/2023   Procedure: POLYPECTOMY;  Surgeon: Eartha Angelia Sieving, MD;  [facility]: [facility];  Service: Gastroenterology;;    Social History: Social History   Socioeconomic History   Marital status: Married    Spouse name: Not on file   Number of children: Not on file   Years of education: Not on file   Highest education level: Not on file  Occupational History   Not on file  Tobacco  Use   Smoking status: Former    Current packs/day: 0.00    Types: Cigarettes    Quit date: 11/29/1982    Years since quitting: 41.2   Smokeless tobacco: Never  Vaping Use   Vaping status: Never Used  Substance and Sexual Activity   Alcohol use: No   Drug use: No   Sexual activity: Not on file  Other Topics  Concern   Not on file  Social History Narrative   Not on file   Social Drivers of Health   Financial Resource Strain: Not on file  Food Insecurity: No Food Insecurity (02/17/2024)   Hunger Vital Sign    Worried About Running Out of Food in the Last Year: Never true    Ran Out of Food in the Last Year: Never true  Transportation Needs: No Transportation Needs (02/17/2024)   PRAPARE - Administrator, Civil Service (Medical): No    Lack of Transportation (Non-Medical): No  Physical Activity: Not on file  Stress: Not on file  Social Connections: Not on file  Intimate Partner Violence: Not At Risk (02/17/2024)   Humiliation, Afraid, Rape, and Kick questionnaire    Fear of Current or Ex-Partner: No    Emotionally Abused: No    Physically Abused: No    Sexually Abused: No    Family History: No family history on file.  Current Medications:  Current Outpatient Medications:    amLODipine-olmesartan (AZOR) 5-20 MG tablet, Take 1 tablet by mouth daily., Disp: , Rfl: 0   aspirin 81 MG chewable tablet, Chew 81 mg by mouth., Disp: , Rfl:    Cholecalciferol 75 MCG (3000 UT) TABS, Take 3,000 Units by mouth daily., Disp: , Rfl:    Cinnamon 500 MG TABS, Take 500 mg by mouth daily., Disp: , Rfl:    GARLIC PO, Take 1 capsule by mouth daily., Disp: , Rfl:    meloxicam  (MOBIC ) 15 MG tablet, Take 1 tablet (15 mg total) by mouth daily., Disp: 30 tablet, Rfl: 0   Omega-3 Fatty Acids (FISH OIL) 500 MG CAPS, Take 500 mg by mouth daily., Disp: , Rfl:    sildenafil  (VIAGRA ) 100 MG tablet, 1/2 to 1 tablet p.o. as needed, Disp: 20 tablet, Rfl: prn   vitamin C (ASCORBIC ACID) 500 MG  tablet, Take 500 mg by mouth daily., Disp: , Rfl:    Allergies: No Known Allergies  REVIEW OF SYSTEMS:   Review of Systems  Constitutional:  Negative for chills, fatigue and fever.  HENT:   Negative for lump/mass, mouth sores, nosebleeds, sore throat and trouble swallowing.   Eyes:  Negative for eye problems.  Respiratory:  Negative for cough and shortness of breath.   Cardiovascular:  Negative for chest pain, leg swelling and palpitations.  Gastrointestinal:  Negative for abdominal pain, constipation, diarrhea, nausea and vomiting.  Genitourinary:  Positive for difficulty urinating. Negative for bladder incontinence, dysuria, frequency, hematuria and nocturia.   Musculoskeletal:  Negative for arthralgias, back pain, flank pain, myalgias and neck pain.  Skin:  Negative for itching and rash.  Neurological:  Negative for dizziness, headaches and numbness.  Hematological:  Does not bruise/bleed easily.  Psychiatric/Behavioral:  Negative for depression, sleep disturbance and suicidal ideas. The patient is not nervous/anxious.   All other systems reviewed and are negative.    VITALS:   Blood pressure (!) 140/91, pulse (!) 59, temperature (!) 97 F (36.1 C), temperature source Tympanic, resp. rate 18, height 5' 7 (1.702 m), weight 226 lb (102.5 kg), SpO2 100%.  Wt Readings from Last 3 Encounters:  02/17/24 226 lb (102.5 kg)  01/29/23 225 lb (102.1 kg)  07/01/22 235 lb (106.6 kg)    Body mass index is 35.4 kg/m.   PHYSICAL EXAM:   Physical Exam Vitals and nursing note reviewed. Exam conducted with a chaperone present.  Constitutional:      Appearance: Normal appearance.  Cardiovascular:  Rate and Rhythm: Normal rate and regular rhythm.     Pulses: Normal pulses.     Heart sounds: Normal heart sounds.  Pulmonary:     Effort: Pulmonary effort is normal.     Breath sounds: Normal breath sounds.  Abdominal:     Palpations: Abdomen is soft. There is no hepatomegaly,  splenomegaly or mass.     Tenderness: There is no abdominal tenderness.  Musculoskeletal:     Right lower leg: No edema.     Left lower leg: No edema.  Lymphadenopathy:     Cervical: No cervical adenopathy.     Right cervical: No superficial, deep or posterior cervical adenopathy.    Left cervical: No superficial, deep or posterior cervical adenopathy.     Upper Body:     Right upper body: No supraclavicular or axillary adenopathy.     Left upper body: No supraclavicular or axillary adenopathy.  Neurological:     General: No focal deficit present.     Mental Status: He is alert and oriented to person, place, and time.  Psychiatric:        Mood and Affect: Mood normal.        Behavior: Behavior normal.     LABS:   CBC    Component Value Date/Time   WBC 5.5 02/17/2024 0847   RBC 4.52 02/17/2024 0848   RBC 4.61 02/17/2024 0847   HGB 14.4 02/17/2024 0847   HCT 43.3 02/17/2024 0847   PLT 232 02/17/2024 0847   MCV 93.9 02/17/2024 0847   MCH 31.2 02/17/2024 0847   MCHC 33.3 02/17/2024 0847   RDW 12.1 02/17/2024 0847   LYMPHSABS 2.1 02/17/2024 0847   MONOABS 0.7 02/17/2024 0847   EOSABS 0.2 02/17/2024 0847   BASOSABS 0.0 02/17/2024 0847    CMP    Component Value Date/Time   NA 142 06/01/2017 1452   K 4.0 06/01/2017 1452   CL 109 06/01/2017 1452   CO2 25 06/01/2017 1452   GLUCOSE 79 06/01/2017 1452   BUN 16 06/01/2017 1452   CREATININE 1.20 04/13/2019 0805   CALCIUM 9.3 06/01/2017 1452   GFRNONAA >60 06/01/2017 1452   GFRAA >60 06/01/2017 1452    No results found for: CEA1, CEA / No results found for: CEA1, CEA Lab Results  Component Value Date   PSA1 3.1 12/15/2023   No results found for: CAN199 No results found for: CAN125  No results found for: STEPHANY RINGS, A1GS, A2GS, BETS, BETA2SER, GAMS, MSPIKE, SPEI Lab Results  Component Value Date   TIBC 362 02/17/2024   FERRITIN 119 02/17/2024   IRONPCTSAT 26 02/17/2024    Lab Results  Component Value Date   LDH 127 02/17/2024     STUDIES:   No results found.

## 2024-02-17 ENCOUNTER — Inpatient Hospital Stay

## 2024-02-17 ENCOUNTER — Inpatient Hospital Stay: Attending: Hematology | Admitting: Hematology

## 2024-02-17 VITALS — BP 140/91 | HR 59 | Temp 97.0°F | Resp 18 | Ht 67.0 in | Wt 226.0 lb

## 2024-02-17 DIAGNOSIS — Z87891 Personal history of nicotine dependence: Secondary | ICD-10-CM | POA: Insufficient documentation

## 2024-02-17 DIAGNOSIS — I129 Hypertensive chronic kidney disease with stage 1 through stage 4 chronic kidney disease, or unspecified chronic kidney disease: Secondary | ICD-10-CM | POA: Insufficient documentation

## 2024-02-17 DIAGNOSIS — Z8042 Family history of malignant neoplasm of prostate: Secondary | ICD-10-CM | POA: Diagnosis not present

## 2024-02-17 DIAGNOSIS — R718 Other abnormality of red blood cells: Secondary | ICD-10-CM

## 2024-02-17 DIAGNOSIS — N1831 Chronic kidney disease, stage 3a: Secondary | ICD-10-CM | POA: Insufficient documentation

## 2024-02-17 DIAGNOSIS — Z8051 Family history of malignant neoplasm of kidney: Secondary | ICD-10-CM | POA: Insufficient documentation

## 2024-02-17 LAB — CBC WITH DIFFERENTIAL/PLATELET
Abs Immature Granulocytes: 0.04 K/uL (ref 0.00–0.07)
Basophils Absolute: 0 K/uL (ref 0.0–0.1)
Basophils Relative: 1 %
Eosinophils Absolute: 0.2 K/uL (ref 0.0–0.5)
Eosinophils Relative: 4 %
HCT: 43.3 % (ref 39.0–52.0)
Hemoglobin: 14.4 g/dL (ref 13.0–17.0)
Immature Granulocytes: 1 %
Lymphocytes Relative: 38 %
Lymphs Abs: 2.1 K/uL (ref 0.7–4.0)
MCH: 31.2 pg (ref 26.0–34.0)
MCHC: 33.3 g/dL (ref 30.0–36.0)
MCV: 93.9 fL (ref 80.0–100.0)
Monocytes Absolute: 0.7 K/uL (ref 0.1–1.0)
Monocytes Relative: 13 %
Neutro Abs: 2.4 K/uL (ref 1.7–7.7)
Neutrophils Relative %: 43 %
Platelets: 232 K/uL (ref 150–400)
RBC: 4.61 MIL/uL (ref 4.22–5.81)
RDW: 12.1 % (ref 11.5–15.5)
WBC: 5.5 K/uL (ref 4.0–10.5)
nRBC: 0 % (ref 0.0–0.2)

## 2024-02-17 LAB — DIRECT ANTIGLOBULIN TEST (NOT AT ARMC)
DAT, IgG: NEGATIVE
DAT, complement: NEGATIVE

## 2024-02-17 LAB — RETICULOCYTES
Immature Retic Fract: 11.9 % (ref 2.3–15.9)
RBC.: 4.52 MIL/uL (ref 4.22–5.81)
Retic Count, Absolute: 62.4 K/uL (ref 19.0–186.0)
Retic Ct Pct: 1.4 % (ref 0.4–3.1)

## 2024-02-17 LAB — IRON AND TIBC
Iron: 94 ug/dL (ref 45–182)
Saturation Ratios: 26 % (ref 17.9–39.5)
TIBC: 362 ug/dL (ref 250–450)
UIBC: 268 ug/dL

## 2024-02-17 LAB — FERRITIN: Ferritin: 119 ng/mL (ref 24–336)

## 2024-02-17 LAB — VITAMIN B12: Vitamin B-12: 214 pg/mL (ref 180–914)

## 2024-02-17 LAB — LACTATE DEHYDROGENASE: LDH: 127 U/L (ref 98–192)

## 2024-02-17 LAB — FOLATE: Folate: 11.6 ng/mL (ref >5.9)

## 2024-02-17 NOTE — Patient Instructions (Signed)
 You were seen and examined today by Dr. Rogers. Dr. Rogers is a hematologist, meaning that he specializes in blood abnormalities. Dr. Rogers discussed your past medical history, family history of cancers/blood conditions and the events that led to you being here today.  You were referred to Dr. Rogers due to having nucleated red blood cells show up on your most recent lab work.  Dr. Katragadda has recommended additional labs today for further evaluation.  Follow-up as scheduled.

## 2024-02-18 LAB — COPPER, SERUM: Copper: 94 ug/dL (ref 69–132)

## 2024-03-10 ENCOUNTER — Inpatient Hospital Stay (HOSPITAL_BASED_OUTPATIENT_CLINIC_OR_DEPARTMENT_OTHER): Admitting: Hematology

## 2024-03-10 VITALS — BP 123/83 | HR 58 | Temp 97.9°F | Resp 16 | Wt 224.2 lb

## 2024-03-10 DIAGNOSIS — R718 Other abnormality of red blood cells: Secondary | ICD-10-CM

## 2024-03-10 NOTE — Progress Notes (Signed)
 [facility] [address] [place] [address]   Clinic Day:  03/10/2024  Referring physician: Bertell Satterfield, MD  Patient Care Team: Kurt Satterfield, MD as PCP - General (Internal Medicine)   ASSESSMENT & PLAN:   Assessment:  1.  Abnormal CBC: - Patient seen at the request of Dr. Rachele.  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 - 01/11/2024: WBC-5.9, hemoglobin-13.6, MCV-96, PLT-240, NRBC 4%.  CBC prior to that on 07/15/2023 did not report any nucleated RBC. - No B symptoms.  No recurrent infections.  He had a sinus infection after the last blood work on 01/11/2024.  2.  Social/family history: - He quit smoking in 1984.  No chemical exposure. - No family history of leukemia.  Brother had prostate cancer.  Another brother had kidney cancer.  Plan:  1.  Elevated nucleated RBC: - We reviewed his labs from 02/17/2024: Repeat CBC with differential did not show any nucleated RBC.  White count and platelet count are normal.  Normal differential. - B12, folic acid  and copper  were normal.  Ferritin is 119 and saturation 26.  DAT test is negative. - NGS pan heme panel is pending.  We will call him with the results. - No further workup is needed at this time.  I will be glad to see him on an as-needed basis.  If there is recurrent nucleated RBC, please refer him back to us .   No orders of the defined types were placed in this encounter.     Kurt Powell,acting as a Neurosurgeon for Kurt Stands, MD.,have documented all relevant documentation on the behalf of Kurt Stands, MD,as directed by  Kurt Stands, MD while in the presence of Kurt Stands, MD.  I, Kurt Stands MD, have reviewed the above documentation for accuracy and completeness, and I agree with the above.    Kurt Stands, MD   7/31/20254:06 PM  CHIEF COMPLAINT/PURPOSE OF CONSULT:   Diagnosis: Abnormal CBC  Current Therapy: Under  workup  HISTORY OF PRESENT ILLNESS:   Kurt Powell is a 63 y.o. male presenting to clinic today for evaluation of nucleated RBC at the request of Kurt Powell Amour, MD.  Patient has a medical history of CKD, hypertension, secondary hyperparathyroidism, and vitamin D deficiency.  Kurt Powell was seen by Dr. Rachele on 01/18/24 for regular follow-up with labs done prior. CBC from 01/11/24 showed elevated nRBC at 4, with all other results within the normal range. He has no prior history of nucleated RBC's on CBC's. Renal panel from 01/11/24 was normal.  His most recent colonoscopy was on 01/29/23 which found nine 3-8 mm polyps in the transverse colon, ascending colon and cecum. One 1 mm polyp was found in the ascending colon and three 3-6 mm polyps were found in the rectum and descending colon. There was diverticulosis in the sigmoid colon and descending colon, as well as non-bleeding internal hemorrhoids.   Today, he states that he is doing well overall. His appetite level is at 100%. His energy level is at 90%.  INTERVAL HISTORY:   Kurt Powell is a 63 y.o. male presenting to the clinic today for follow-up of elevated nRBC. He was last seen by me on 02/17/2024 in consultation.  Today, he states that he is doing well overall. His appetite level is at 100%. His energy level is at 100%. Hersh has follow-up with Dr. Rachele in December 2025.   PAST MEDICAL HISTORY:   Past Medical History: Past Medical History:  Diagnosis Date   Hypertension    Sleep apnea     Surgical History: Past Surgical History:  Procedure Laterality Date   BIOPSY  01/29/2023   Procedure: BIOPSY;  Surgeon: Eartha Angelia Sieving, MD;  [facility];  Service: Gastroenterology;;   COLONOSCOPY N/A 11/29/2015   Procedure: COLONOSCOPY;  Surgeon: Claudis RAYMOND Rivet, MD;  [facility];  Service: Endoscopy;  Laterality: N/A;  930   COLONOSCOPY WITH PROPOFOL  N/A 01/29/2023   Procedure: COLONOSCOPY WITH PROPOFOL ;   Surgeon: Eartha Angelia Sieving, MD;  [facility];  Service: Gastroenterology;  Laterality: N/A;  1:30PM;ASA 1-2   MASS EXCISION Right 06/05/2017   Procedure: EXCISION SUBCUTANEOUS MASS RIGHT FLANK 8CM;  Surgeon: Mavis Anes, MD;  Location: [facility];  Service: General;  Laterality: Right;   NO PAST SURGERIES     POLYPECTOMY  01/29/2023   Procedure: POLYPECTOMY;  Surgeon: Eartha Angelia Sieving, MD;  [facility];  Service: Gastroenterology;;    Social History: Social History   Socioeconomic History   Marital status: Married    Spouse name: Not on file   Number of children: Not on file   Years of education: Not on file   Highest education level: Not on file  Occupational History   Not on file  Tobacco Use   Smoking status: Former    Current packs/day: 0.00    Types: Cigarettes    Quit date: 11/29/1982    Years since quitting: 41.3   Smokeless tobacco: Never  Vaping Use   Vaping status: Never Used  Substance and Sexual Activity   Alcohol use: No   Drug use: No   Sexual activity: Not on file  Other Topics Concern   Not on file  Social History Narrative   Not on file   Social Drivers of Health   Financial Resource Strain: Not on file  Food Insecurity: No Food Insecurity (02/17/2024)   Hunger Vital Sign    Worried About Running Out of Food in the Last Year: Never true    Ran Out of Food in the Last Year: Never true  Transportation Needs: No Transportation Needs (02/17/2024)   PRAPARE - Administrator, Civil Service (Medical): No    Lack of Transportation (Non-Medical): No  Physical Activity: Not on file  Stress: Not on file  Social Connections: Not on file  Intimate Partner Violence: Not At Risk (02/17/2024)   Humiliation, Afraid, Rape, and Kick questionnaire    Fear of Current or Ex-Partner: No    Emotionally Abused: No    Physically Abused: No    Sexually Abused: No    Family History: No family history on file.  Current  Medications:  Current Outpatient Medications:    amLODipine-olmesartan (AZOR) 5-20 MG tablet, Take 1 tablet by mouth daily., Disp: , Rfl: 0   aspirin 81 MG chewable tablet, Chew 81 mg by mouth., Disp: , Rfl:    Cholecalciferol 75 MCG (3000 UT) TABS, Take 3,000 Units by mouth daily., Disp: , Rfl:    Cinnamon 500 MG TABS, Take 500 mg by mouth daily., Disp: , Rfl:    GARLIC PO, Take 1 capsule by mouth daily., Disp: , Rfl:    meloxicam  (MOBIC ) 15 MG tablet, Take 1 tablet (15 mg total) by mouth daily., Disp: 30 tablet, Rfl: 0   Omega-3 Fatty Acids (FISH OIL) 500 MG CAPS, Take 500 mg by mouth daily., Disp: , Rfl:    sildenafil  (VIAGRA ) 100 MG tablet,  1/2 to 1 tablet p.o. as needed, Disp: 20 tablet, Rfl: prn   vitamin C (ASCORBIC ACID) 500 MG tablet, Take 500 mg by mouth daily., Disp: , Rfl:    Allergies: No Known Allergies  REVIEW OF SYSTEMS:   Review of Systems  Constitutional:  Negative for chills, fatigue and fever.  HENT:   Negative for lump/mass, mouth sores, nosebleeds, sore throat and trouble swallowing.   Eyes:  Negative for eye problems.  Respiratory:  Negative for cough and shortness of breath.   Cardiovascular:  Negative for chest pain, leg swelling and palpitations.  Gastrointestinal:  Negative for abdominal pain, constipation, diarrhea, nausea and vomiting.  Genitourinary:  Negative for bladder incontinence, difficulty urinating, dysuria, frequency, hematuria and nocturia.   Musculoskeletal:  Negative for arthralgias, back pain, flank pain, myalgias and neck pain.  Skin:  Negative for itching and rash.  Neurological:  Negative for dizziness, headaches and numbness.  Hematological:  Does not bruise/bleed easily.  Psychiatric/Behavioral:  Negative for depression, sleep disturbance and suicidal ideas. The patient is not nervous/anxious.   All other systems reviewed and are negative.    VITALS:   Blood pressure 123/83, pulse (!) 58, temperature 97.9 F (36.6 C), temperature  source Oral, resp. rate 16, weight 224 lb 3.3 oz (101.7 kg), SpO2 98%.  Wt Readings from Last 3 Encounters:  03/10/24 224 lb 3.3 oz (101.7 kg)  02/17/24 226 lb (102.5 kg)  01/29/23 225 lb (102.1 kg)    Body mass index is 35.12 kg/m.   PHYSICAL EXAM:   Physical Exam Vitals and nursing note reviewed. Exam conducted with a chaperone present.  Constitutional:      Appearance: Normal appearance.  Cardiovascular:     Rate and Rhythm: Normal rate and regular rhythm.     Pulses: Normal pulses.     Heart sounds: Normal heart sounds.  Pulmonary:     Effort: Pulmonary effort is normal.     Breath sounds: Normal breath sounds.  Abdominal:     Palpations: Abdomen is soft. There is no hepatomegaly, splenomegaly or mass.     Tenderness: There is no abdominal tenderness.  Musculoskeletal:     Right lower leg: No edema.     Left lower leg: No edema.  Lymphadenopathy:     Cervical: No cervical adenopathy.     Right cervical: No superficial, deep or posterior cervical adenopathy.    Left cervical: No superficial, deep or posterior cervical adenopathy.     Upper Body:     Right upper body: No supraclavicular or axillary adenopathy.     Left upper body: No supraclavicular or axillary adenopathy.  Neurological:     General: No focal deficit present.     Mental Status: He is alert and oriented to person, place, and time.  Psychiatric:        Mood and Affect: Mood normal.        Behavior: Behavior normal.     LABS:   CBC    Component Value Date/Time   WBC 5.5 02/17/2024 0847   RBC 4.52 02/17/2024 0848   RBC 4.61 02/17/2024 0847   HGB 14.4 02/17/2024 0847   HCT 43.3 02/17/2024 0847   PLT 232 02/17/2024 0847   MCV 93.9 02/17/2024 0847   MCH 31.2 02/17/2024 0847   MCHC 33.3 02/17/2024 0847   RDW 12.1 02/17/2024 0847   LYMPHSABS 2.1 02/17/2024 0847   MONOABS 0.7 02/17/2024 0847   EOSABS 0.2 02/17/2024 0847   BASOSABS 0.0 02/17/2024 0847  CMP    Component Value Date/Time    NA 142 06/01/2017 1452   K 4.0 06/01/2017 1452   CL 109 06/01/2017 1452   CO2 25 06/01/2017 1452   GLUCOSE 79 06/01/2017 1452   BUN 16 06/01/2017 1452   CREATININE 1.20 04/13/2019 0805   CALCIUM 9.3 06/01/2017 1452   GFRNONAA >60 06/01/2017 1452   GFRAA >60 06/01/2017 1452    No results found for: CEA1, CEA / No results found for: CEA1, CEA Lab Results  Component Value Date   PSA1 3.1 12/15/2023   No results found for: CAN199 No results found for: CAN125  No results found for: STEPHANY RINGS, A1GS, A2GS, BETS, BETA2SER, GAMS, MSPIKE, SPEI Lab Results  Component Value Date   TIBC 362 02/17/2024   FERRITIN 119 02/17/2024   IRONPCTSAT 26 02/17/2024   Lab Results  Component Value Date   LDH 127 02/17/2024     STUDIES:   No results found.

## 2024-03-10 NOTE — Patient Instructions (Addendum)
 [facility] at [facility] Discharge Instructions   You were seen and examined today by Dr. Rogers.  He reviewed the results of your lab work which are normal/stable.   We will see you back as needed. There is no need for you to follow up in the clinic at this time.     Thank you for choosing [facility] at [facility] to provide your oncology and hematology care.  To afford each patient quality time with our provider, please arrive at least 15 minutes before your scheduled appointment time.   If you have a lab appointment with the Cancer Center please come in thru the Main Entrance and check in at the main information desk.  You need to re-schedule your appointment should you arrive 10 or more minutes late.  We strive to give you quality time with our providers, and arriving late affects you and other patients whose appointments are after yours.  Also, if you no show three or more times for appointments you may be dismissed from the clinic at the providers discretion.     Again, thank you for choosing [facility].  Our hope is that these requests will decrease the amount of time that you wait before being seen by our physicians.       _____________________________________________________________  Should you have questions after your visit to [facility], please contact our office at [phone] and follow the prompts.  Our office hours are 8:00 a.m. and 4:30 p.m. Monday - Friday.  Please note that voicemails left after 4:00 p.m. may not be returned until the following business day.  We are closed weekends and major holidays.  You do have access to a nurse 24-7, just call the main number to the clinic [phone] and do not press any options, hold on the line and a nurse will answer the phone.    For prescription refill requests, have your pharmacy contact our office and allow 72 hours.    Due to Covid, you will need  to wear a mask upon entering the hospital. If you do not have a mask, a mask will be given to you at the Main Entrance upon arrival. For doctor visits, patients may have 1 support person age 42 or older with them. For treatment visits, patients can not have anyone with them due to social distancing guidelines and our immunocompromised population.

## 2024-03-27 LAB — MISC LABCORP TEST (SEND OUT): Labcorp test code: 452322

## 2024-03-28 ENCOUNTER — Telehealth: Payer: Self-pay | Admitting: *Deleted

## 2024-03-28 NOTE — Telephone Encounter (Signed)
-----   Message from Kurt Powell sent at 03/10/2024  4:03 PM EDT ----- Jian Hodgman, He has pan heme myeloid panel (NGS from [facility]) from 02/17/2024 is pending.  Nobody knows when the results will be back.  Once it is back, please call and let him know the results.  Thanks.

## 2024-03-28 NOTE — Telephone Encounter (Signed)
-----   Message from Alean Stands sent at 03/10/2024  4:03 PM EDT ----- Jian Hodgman, He has pan heme myeloid panel (NGS from [facility]) from 02/17/2024 is pending.  Nobody knows when the results will be back.  Once it is back, please call and let him know the results.  Thanks.

## 2024-03-28 NOTE — Telephone Encounter (Signed)
 Attempted to contact patient with results.  May follow up as needed.  Message left for a return call.

## 2024-03-28 NOTE — Telephone Encounter (Signed)
 Patient made aware and verbalized understanding.

## 2024-05-04 ENCOUNTER — Ambulatory Visit: Admitting: Internal Medicine

## 2024-05-04 ENCOUNTER — Encounter: Payer: Self-pay | Admitting: Internal Medicine

## 2024-05-04 VITALS — BP 137/85 | HR 65 | Ht 67.0 in | Wt 232.0 lb

## 2024-05-04 DIAGNOSIS — I10 Essential (primary) hypertension: Secondary | ICD-10-CM | POA: Diagnosis not present

## 2024-05-04 DIAGNOSIS — R718 Other abnormality of red blood cells: Secondary | ICD-10-CM | POA: Diagnosis not present

## 2024-05-04 DIAGNOSIS — Z23 Encounter for immunization: Secondary | ICD-10-CM | POA: Diagnosis not present

## 2024-05-04 DIAGNOSIS — H6122 Impacted cerumen, left ear: Secondary | ICD-10-CM | POA: Diagnosis not present

## 2024-05-04 DIAGNOSIS — R7303 Prediabetes: Secondary | ICD-10-CM | POA: Insufficient documentation

## 2024-05-04 DIAGNOSIS — N182 Chronic kidney disease, stage 2 (mild): Secondary | ICD-10-CM | POA: Diagnosis not present

## 2024-05-04 MED ORDER — AMLODIPINE-OLMESARTAN 5-20 MG PO TABS: 1.0000 | ORAL_TABLET | Freq: Every day | ORAL | 3 refills | Status: AC

## 2024-05-04 NOTE — Assessment & Plan Note (Signed)
 HbA1c: 5.8 (06/25) Advised to follow DASH diet

## 2024-05-04 NOTE — Progress Notes (Signed)
 New Patient Office Visit  Subjective:  Patient ID: Kurt Powell, male    DOB: 10/09/1960  Age: 63 y.o. MRN: [id number]  CC:  Chief Complaint  Patient presents with   Establish Care    HPI Kurt Powell is a 63 y.o. male with past medical history of HTN and BPH who presents for establishing care.  HTN: His BP is wnl today.  He takes amlodipine -olmesartan  5-20 mg QD regularly.  Denies any headache, dizziness, chest pain, dyspnea or palpitations.  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 Rachele.  His GFR usually stays around 60s.  Denies dysuria, hematuria currently.  He has a history of BPH, followed by Dr. Matilda.  He prefers to avoid medicine for BPH.  Excess earwax: He reports chronic, recurrent excess earwax on left side.  He has tried using Debrox eardrops in the past with mild relief.  He states that it gets worse after placing earplugs while mowing his grass.  Denies hearing difficulty, tinnitus or ear pain or discharge.   Past Medical History:  Diagnosis Date   Hypertension    Sleep apnea     Past Surgical History:  Procedure Laterality Date   BIOPSY  01/29/2023   Procedure: BIOPSY;  Surgeon: Eartha Angelia Sieving, MD;  [facility];  Service: Gastroenterology;;   COLONOSCOPY N/A 11/29/2015   Procedure: COLONOSCOPY;  Surgeon: Claudis RAYMOND Rivet, MD;  [facility];  Service: Endoscopy;  Laterality: N/A;  930   COLONOSCOPY WITH PROPOFOL  N/A 01/29/2023   Procedure: COLONOSCOPY WITH PROPOFOL ;  Surgeon: Eartha Angelia Sieving, MD;  [facility];  Service: Gastroenterology;  Laterality: N/A;  1:30PM;ASA 1-2   MASS EXCISION Right 06/05/2017   Procedure: EXCISION SUBCUTANEOUS MASS RIGHT FLANK 8CM;  Surgeon: Mavis Anes, MD;  Location: [facility];  Service: General;  Laterality: Right;   NO PAST SURGERIES     POLYPECTOMY  01/29/2023   Procedure: POLYPECTOMY;  Surgeon: Eartha Angelia Sieving, MD;  [facility];  Service:  Gastroenterology;;    History reviewed. No pertinent family history.  Social History   Socioeconomic History   Marital status: Married    Spouse name: Not on file   Number of children: Not on file   Years of education: Not on file   Highest education level: Not on file  Occupational History   Not on file  Tobacco Use   Smoking status: Former    Current packs/day: 0.00    Types: Cigarettes    Quit date: 11/29/1982    Years since quitting: 41.4   Smokeless tobacco: Never  Vaping Use   Vaping status: Never Used  Substance and Sexual Activity   Alcohol use: No   Drug use: No   Sexual activity: Not on file  Other Topics Concern   Not on file  Social History Narrative   Not on file   Social Drivers of Health   Financial Resource Strain: Not on file  Food Insecurity: No Food Insecurity (02/17/2024)   Hunger Vital Sign    Worried About Running Out of Food in the Last Year: Never true    Ran Out of Food in the Last Year: Never true  Transportation Needs: No Transportation Needs (02/17/2024)   PRAPARE - Administrator, Civil Service (Medical): No    Lack of Transportation (Non-Medical): No  Physical Activity: Not on file  Stress: Not on file  Social Connections: Not on file  Intimate Partner Violence:  Not At Risk (02/17/2024)   Humiliation, Afraid, Rape, and Kick questionnaire    Fear of Current or Ex-Partner: No    Emotionally Abused: No    Physically Abused: No    Sexually Abused: No    ROS Review of Systems  Constitutional:  Negative for chills and fever.  HENT:  Negative for congestion and sore throat.   Eyes:  Negative for pain and discharge.  Respiratory:  Negative for cough and shortness of breath.   Cardiovascular:  Negative for chest pain and palpitations.  Gastrointestinal:  Negative for diarrhea, nausea and vomiting.  Endocrine: Negative for polydipsia and polyuria.  Genitourinary:  Negative for dysuria and hematuria.  Musculoskeletal:  Negative  for neck pain and neck stiffness.  Skin:  Negative for rash.  Neurological:  Negative for dizziness and weakness.  Psychiatric/Behavioral:  Negative for agitation and behavioral problems.     Objective:   Today's Vitals: BP 137/85   Pulse 65   Ht 5' 7 (1.702 m)   Wt 232 lb (105.2 kg)   SpO2 98%   BMI 36.34 kg/m   Physical Exam Vitals reviewed.  Constitutional:      General: He is not in acute distress.    Appearance: He is not diaphoretic.  HENT:     Head: Normocephalic and atraumatic.     Ears:     Comments: Excess earwax on left side    Nose: Nose normal.     Mouth/Throat:     Mouth: Mucous membranes are moist.  Eyes:     General: No scleral icterus.    Extraocular Movements: Extraocular movements intact.  Cardiovascular:     Rate and Rhythm: Normal rate and regular rhythm.     Heart sounds: Normal heart sounds. No murmur heard. Pulmonary:     Breath sounds: Normal breath sounds. No wheezing or rales.  Musculoskeletal:     Cervical back: Neck supple. No tenderness.     Right lower leg: No edema.     Left lower leg: No edema.  Skin:    General: Skin is warm.     Findings: No rash.  Neurological:     General: No focal deficit present.     Mental Status: He is alert and oriented to person, place, and time.  Psychiatric:        Mood and Affect: Mood normal.        Behavior: Behavior normal.     Assessment & Plan:   Problem List Items Addressed This Visit       Cardiovascular and Mediastinum   Essential hypertension - Primary   BP Readings from Last 1 Encounters:  05/04/24 137/85   Well-controlled with amlodipine -olmesartan  5-20 mg QD Counseled for compliance with the medications Advised DASH diet and moderate exercise/walking, at least 150 mins/week      Relevant Medications   amLODipine -olmesartan  (AZOR ) 5-20 MG tablet     Nervous and Auditory   Impacted cerumen of left ear   Advised to use Debrox eardrop Avoid any sharp objects for cleaning  purposes, can use washcloth If persistent, may need ear irrigation        Genitourinary   CKD (chronic kidney disease), stage II   Last BMP reviewed Followed by nephrology-Dr. Rachele Avoid nephrotoxic agents On ARB Advised to maintain adequate hydration        Other   Abnormal RBC   Had higher percentage of nucleated RBCs, had Hematology evaluation, was deemed to be benign - resolved later  Prediabetes   HbA1c: 5.8 (06/25) Advised to follow DASH diet      Other Visit Diagnoses       Encounter for immunization       Relevant Orders   Flu vaccine trivalent PF, 6mos and older(Flulaval,Afluria,Fluarix,Fluzone) (Completed)       Outpatient Encounter Medications as of 05/04/2024  Medication Sig   aspirin 81 MG chewable tablet Chew 81 mg by mouth.   Cholecalciferol 75 MCG (3000 UT) TABS Take 3,000 Units by mouth daily.   Cinnamon 500 MG TABS Take 500 mg by mouth daily.   GARLIC PO Take 1 capsule by mouth daily.   Omega-3 Fatty Acids (FISH OIL) 500 MG CAPS Take 500 mg by mouth daily.   vitamin C (ASCORBIC ACID) 500 MG tablet Take 500 mg by mouth daily.   [DISCONTINUED] amLODipine -olmesartan  (AZOR ) 5-20 MG tablet Take 1 tablet by mouth daily.   amLODipine -olmesartan  (AZOR ) 5-20 MG tablet Take 1 tablet by mouth daily.   sildenafil  (VIAGRA ) 100 MG tablet 1/2 to 1 tablet p.o. as needed (Patient not taking: Reported on 05/04/2024)   [DISCONTINUED] meloxicam  (MOBIC ) 15 MG tablet Take 1 tablet (15 mg total) by mouth daily. (Patient not taking: Reported on 05/04/2024)   No facility-administered encounter medications on file as of 05/04/2024.    Follow-up: Return in about 6 months (around 11/01/2024) for HTN.   Suzzane MARLA Blanch, MD

## 2024-05-04 NOTE — Assessment & Plan Note (Signed)
 Last BMP reviewed Followed by nephrology-Dr. Rachele Avoid nephrotoxic agents On ARB Advised to maintain adequate hydration

## 2024-05-04 NOTE — Assessment & Plan Note (Signed)
 BP Readings from Last 1 Encounters:  05/04/24 137/85   Well-controlled with amlodipine -olmesartan  5-20 mg QD Counseled for compliance with the medications Advised DASH diet and moderate exercise/walking, at least 150 mins/week

## 2024-05-04 NOTE — Patient Instructions (Signed)
Please continue to take medications as prescribed.  Please continue to follow low salt diet and perform moderate exercise/walking at least 150 mins/week. 

## 2024-05-04 NOTE — Assessment & Plan Note (Signed)
 Had higher percentage of nucleated RBCs, had Hematology evaluation, was deemed to be benign - resolved later

## 2024-05-04 NOTE — Assessment & Plan Note (Signed)
 Advised to use Debrox eardrop Avoid any sharp objects for cleaning purposes, can use washcloth If persistent, may need ear irrigation

## 2024-05-25 ENCOUNTER — Encounter (INDEPENDENT_AMBULATORY_CARE_PROVIDER_SITE_OTHER): Payer: Self-pay | Admitting: Gastroenterology

## 2024-07-01 ENCOUNTER — Ambulatory Visit: Admitting: Family Medicine

## 2024-07-01 ENCOUNTER — Encounter: Payer: Self-pay | Admitting: Family Medicine

## 2024-07-01 ENCOUNTER — Ambulatory Visit: Payer: Self-pay

## 2024-07-01 VITALS — BP 146/87 | HR 70 | Ht 67.0 in | Wt 233.0 lb

## 2024-07-01 DIAGNOSIS — K219 Gastro-esophageal reflux disease without esophagitis: Secondary | ICD-10-CM | POA: Diagnosis not present

## 2024-07-01 DIAGNOSIS — R1310 Dysphagia, unspecified: Secondary | ICD-10-CM | POA: Diagnosis not present

## 2024-07-01 DIAGNOSIS — G4730 Sleep apnea, unspecified: Secondary | ICD-10-CM

## 2024-07-01 MED ORDER — PANTOPRAZOLE SODIUM 20 MG PO TBEC: 20.0000 mg | DELAYED_RELEASE_TABLET | Freq: Every day | ORAL | 1 refills | Status: AC

## 2024-07-01 NOTE — Telephone Encounter (Signed)
Noted patient scheduled

## 2024-07-01 NOTE — Progress Notes (Unsigned)
 Acute Office Visit  Subjective:    Patient ID: Kurt Powell, male    DOB: [birth year], 63 y.o.   MRN: [id number]  Chief Complaint  Patient presents with   Dysphagia    Difficulty swallowing , having issues with getting choked since having a sinus infection    HPI The patient reports a sensation of throat swelling and choking episodes. He states that his throat first felt swollen during a sinus infection approximately one month ago. Since then, he has experienced intermittent choking episodes, lasting up to 30 seconds, during which he feels unable to catch his breath. He notes that these episodes occur randomly, and are often followed by coughing spells. He is able to swallow but describes a persistent sensation of throat fullness, similar to a sore throat, although he denies actual throat pain.  Two nights ago, he awoke feeling as though his CPAP machine was choking him, with a strong sensation of throat tightness. He has a history of sleep apnea diagnosed 11-12 years ago and continues to use CPAP nightly. He denies fever, drooling, voice changes, or complete inability to swallow.      Past Medical History:  Diagnosis Date   Hypertension    Sleep apnea     Past Surgical History:  Procedure Laterality Date   BIOPSY  01/29/2023   Procedure: BIOPSY;  Surgeon: Eartha Angelia Sieving, MD;  [facility];  Service: Gastroenterology;;   COLONOSCOPY N/A 11/29/2015   Procedure: COLONOSCOPY;  Surgeon: Claudis RAYMOND Rivet, MD;  [facility];  Service: Endoscopy;  Laterality: N/A;  930   COLONOSCOPY WITH PROPOFOL  N/A 01/29/2023   Procedure: COLONOSCOPY WITH PROPOFOL ;  Surgeon: Eartha Angelia Sieving, MD;  [facility];  Service: Gastroenterology;  Laterality: N/A;  1:30PM;ASA 1-2   MASS EXCISION Right 06/05/2017   Procedure: EXCISION SUBCUTANEOUS MASS RIGHT FLANK 8CM;  Surgeon: Mavis Anes, MD;  Location: [facility];  Service: General;  Laterality: Right;   NO  PAST SURGERIES     POLYPECTOMY  01/29/2023   Procedure: POLYPECTOMY;  Surgeon: Eartha Angelia Sieving, MD;  [facility];  Service: Gastroenterology;;    No family history on file.  Social History   Socioeconomic History   Marital status: Married    Spouse name: Not on file   Number of children: Not on file   Years of education: Not on file   Highest education level: Not on file  Occupational History   Not on file  Tobacco Use   Smoking status: Former    Current packs/day: 0.00    Types: Cigarettes    Quit date: 11/29/1982    Years since quitting: 41.6   Smokeless tobacco: Never  Vaping Use   Vaping status: Never Used  Substance and Sexual Activity   Alcohol use: No   Drug use: No   Sexual activity: Not on file  Other Topics Concern   Not on file  Social History Narrative   Not on file   Social Drivers of Health   Financial Resource Strain: Not on file  Food Insecurity: No Food Insecurity (02/17/2024)   Hunger Vital Sign    Worried About Running Out of Food in the Last Year: Never true    Ran Out of Food in the Last Year: Never true  Transportation Needs: No Transportation Needs (02/17/2024)   PRAPARE - Administrator, Civil Service (Medical): No    Lack of Transportation (Non-Medical): No  Physical Activity: Not  on file  Stress: Not on file  Social Connections: Not on file  Intimate Partner Violence: Not At Risk (02/17/2024)   Humiliation, Afraid, Rape, and Kick questionnaire    Fear of Current or Ex-Partner: No    Emotionally Abused: No    Physically Abused: No    Sexually Abused: No    Outpatient Medications Prior to Visit  Medication Sig Dispense Refill   amLODipine -olmesartan  (AZOR ) 5-20 MG tablet Take 1 tablet by mouth daily. 90 tablet 3   aspirin 81 MG chewable tablet Chew 81 mg by mouth.     Cholecalciferol 75 MCG (3000 UT) TABS Take 3,000 Units by mouth daily.     Cinnamon 500 MG TABS Take 500 mg by mouth daily.     GARLIC PO  Take 1 capsule by mouth daily.     Omega-3 Fatty Acids (FISH OIL) 500 MG CAPS Take 500 mg by mouth daily.     sildenafil  (VIAGRA ) 100 MG tablet 1/2 to 1 tablet p.o. as needed (Patient not taking: Reported on 05/04/2024) 20 tablet prn   vitamin C (ASCORBIC ACID) 500 MG tablet Take 500 mg by mouth daily.     No facility-administered medications prior to visit.    No Known Allergies  Review of Systems  Constitutional:  Negative for fatigue and fever.  Eyes:  Negative for visual disturbance.  Respiratory:  Negative for chest tightness and shortness of breath.   Cardiovascular:  Negative for chest pain and palpitations.  Gastrointestinal:        Difficulty swallowing  Neurological:  Negative for dizziness and headaches.       Objective:    Physical Exam HENT:     Head: Normocephalic.     Right Ear: External ear normal.     Left Ear: External ear normal.     Nose: No congestion or rhinorrhea.     Mouth/Throat:     Mouth: Mucous membranes are moist.  Cardiovascular:     Rate and Rhythm: Regular rhythm.     Heart sounds: No murmur heard. Pulmonary:     Effort: No respiratory distress.     Breath sounds: Normal breath sounds.  Neurological:     Mental Status: He is alert.     BP (!) 146/87   Pulse 70   Ht 5' 7 (1.702 m)   Wt 233 lb (105.7 kg)   SpO2 96%   BMI 36.49 kg/m  Wt Readings from Last 3 Encounters:  07/01/24 233 lb (105.7 kg)  05/04/24 232 lb (105.2 kg)  03/10/24 224 lb 3.3 oz (101.7 kg)       Assessment & Plan:  Dysphagia, unspecified type Assessment & Plan: Orders have been placed for a modified barium swallow study to assess for swallowing dysfunction and aspiration risk. -A referral has been placed to Gastroenterology for evaluation of possible esophageal causes of dysphagia and globus sensation. -Orders have also been placed for a repeat home sleep test, as symptoms may be related to excessive CPAP pressure or airway irritation. -Will initiate a trial  of proton pump inhibitor therapy with Protonix  20 mg daily to address possible reflux-related symptoms.  Symptom Management -Encourage small, slow bites and thorough chewing when eating. -Avoid eating right before bedtime. -Avoid dry, hard foods that may worsen choking. -Encourage hydration to aid swallowing.  Red Flag Symptoms - Seek Immediate Care If: -Inability to swallow saliva -Drooling -Severe shortness of breath -Cyanosis (blue lips/face) -Progressive throat swelling -Recurrent choking with inability to breath  Please  follow up if your symptoms worsen or fail to improve.  Orders: -     Ambulatory referral to Gastroenterology -     SLP modified barium swallow  Gastroesophageal reflux disease without esophagitis -     Pantoprazole  Sodium; Take 1 tablet (20 mg total) by mouth daily.  Dispense: 30 tablet; Refill: 1  Sleep apnea, unspecified type -     Home sleep test  Note: This chart has been completed using Engineer, Civil (consulting) software, and while attempts have been made to ensure accuracy, certain words and phrases may not be transcribed as intended.    Shatika Grinnell  Z Bacchus, FNP

## 2024-07-01 NOTE — Telephone Encounter (Signed)
 FYI Only or Action Required?: FYI only for provider: appointment scheduled on 07/01/2024.  Patient was last seen in primary care on 05/04/2024 by [name], MD.  Called Nurse Triage reporting Sore Throat.  Symptoms began about a month ago.  Interventions attempted: Nothing.  Symptoms are: unchanged.  Triage Disposition: Go to ED Now (or PCP Triage)  Patient/caregiver understands and will follow disposition?: Yes  Copied from CRM #[id number]. Topic: Clinical - Red Word Triage >> Jul 01, 2024  9:24 AM Joesph NOVAK wrote: Red Word that prompted transfer to Nurse Triage: swollen throat, hard to talk, eat and drink. Reason for Disposition  [1] Difficulty breathing AND [2] not severe  Answer Assessment - Initial Assessment Questions 1. ONSET: When did the throat start hurting? (Hours or days ago)      Sinus issues started about a month ago 2. SEVERITY: How bad is the sore throat? (Scale 1-10; mild, moderate or severe)     Throat issues worsening, feels difficulty swallowing at times 4.  VIRAL SYMPTOMS: Are there any symptoms of a cold, such as a runny nose, cough, hoarse voice or red eyes?      Recent sinus draniage issues 5. FEVER: Do you have a fever? If Yes, ask: What is your temperature, how was it measured, and when did it start?     denies  7. OTHER SYMPTOMS: Do you have any other symptoms? (e.g., difficulty breathing, headache, rash)     Denies any difficulty breathing except for deep breaths cause a cough  Protocols used: Sore Throat-A-AH

## 2024-07-01 NOTE — Patient Instructions (Addendum)
 I appreciate the opportunity to provide care to you today!   Throat Swelling / Choking Sensation  -Orders have been placed for a modified barium swallow study to assess for swallowing dysfunction and aspiration risk. -A referral has been placed to Gastroenterology for evaluation of possible esophageal causes of dysphagia and globus sensation. -Orders have also been placed for a repeat home sleep test, as symptoms may be related to excessive CPAP pressure or airway irritation. -Will initiate a trial of proton pump inhibitor therapy with Protonix  20 mg daily to address possible reflux-related symptoms.  Symptom Management -Encourage small, slow bites and thorough chewing when eating. -Avoid eating right before bedtime. -Avoid dry, hard foods that may worsen choking. -Encourage hydration to aid swallowing.  Red Flag Symptoms - Seek Immediate Care If: -Inability to swallow saliva -Drooling -Severe shortness of breath -Cyanosis (blue lips/face) -Progressive throat swelling -Recurrent choking with inability to breath  Please follow up if your symptoms worsen or fail to improve.    Please continue to a heart-healthy diet and increase your physical activities. Try to exercise for at least five days a week.    It was a pleasure to see you and I look forward to continuing to work together on your health and well-being. Please do not hesitate to call the office if you need care or have questions about your care.  In case of emergency, please visit the Emergency Department for urgent care, or contact our clinic at [phone] to schedule an appointment. We're here to help you!   Have a wonderful day and week. With Gratitude, Meade JENEANE Gerlach MSN, FNP-BC, PMHNP-BC

## 2024-07-04 DIAGNOSIS — R1310 Dysphagia, unspecified: Secondary | ICD-10-CM | POA: Insufficient documentation

## 2024-07-04 NOTE — Assessment & Plan Note (Signed)
 Orders have been placed for a modified barium swallow study to assess for swallowing dysfunction and aspiration risk. -A referral has been placed to Gastroenterology for evaluation of possible esophageal causes of dysphagia and globus sensation. -Orders have also been placed for a repeat home sleep test, as symptoms may be related to excessive CPAP pressure or airway irritation. -Will initiate a trial of proton pump inhibitor therapy with Protonix  20 mg daily to address possible reflux-related symptoms.  Symptom Management -Encourage small, slow bites and thorough chewing when eating. -Avoid eating right before bedtime. -Avoid dry, hard foods that may worsen choking. -Encourage hydration to aid swallowing.  Red Flag Symptoms - Seek Immediate Care If: -Inability to swallow saliva -Drooling -Severe shortness of breath -Cyanosis (blue lips/face) -Progressive throat swelling -Recurrent choking with inability to breath  Please follow up if your symptoms worsen or fail to improve.

## 2024-07-05 ENCOUNTER — Encounter (INDEPENDENT_AMBULATORY_CARE_PROVIDER_SITE_OTHER): Payer: Self-pay | Admitting: *Deleted

## 2024-07-27 ENCOUNTER — Encounter (INDEPENDENT_AMBULATORY_CARE_PROVIDER_SITE_OTHER): Payer: Self-pay | Admitting: Gastroenterology

## 2024-07-27 ENCOUNTER — Other Ambulatory Visit: Payer: Self-pay | Admitting: Internal Medicine

## 2024-07-27 ENCOUNTER — Ambulatory Visit (INDEPENDENT_AMBULATORY_CARE_PROVIDER_SITE_OTHER): Admitting: Gastroenterology

## 2024-07-27 VITALS — BP 120/76 | HR 67 | Temp 97.9°F | Ht 67.0 in | Wt 229.2 lb

## 2024-07-27 DIAGNOSIS — R7989 Other specified abnormal findings of blood chemistry: Secondary | ICD-10-CM

## 2024-07-27 DIAGNOSIS — R1310 Dysphagia, unspecified: Secondary | ICD-10-CM | POA: Diagnosis not present

## 2024-07-27 DIAGNOSIS — J329 Chronic sinusitis, unspecified: Secondary | ICD-10-CM

## 2024-07-27 DIAGNOSIS — Z6835 Body mass index (BMI) 35.0-35.9, adult: Secondary | ICD-10-CM | POA: Diagnosis not present

## 2024-07-27 DIAGNOSIS — R09A2 Foreign body sensation, throat: Secondary | ICD-10-CM | POA: Diagnosis not present

## 2024-07-27 DIAGNOSIS — R1319 Other dysphagia: Secondary | ICD-10-CM

## 2024-07-27 DIAGNOSIS — Z860100 Personal history of colon polyps, unspecified: Secondary | ICD-10-CM

## 2024-07-27 NOTE — Patient Instructions (Signed)
 It was very nice to meet you today, as dicussed with will plan for the following :  1) Upper endoscopy  2) see an ENT

## 2024-07-27 NOTE — H&P (View-Only) (Signed)
 Kurt Powell , M.D. Gastroenterology & Hepatology [facility] Gastroenterology [address] [place], [place] [address] Primary Care Physician: Kurt Suzzane POUR, MD [address] [place] [address]  Chief Complaint:   dysphagia/choking spells  History of Present Illness: Kurt Powell is a 63 y.o. male with hypertension, sleep apnea and colonic polyps who presents for evaluation of dysphagia/choking spells/globus sensation  Patient last screening colonoscopy directly scheduled and performed with Dr. Eartha.  This is the first time he has been seen in our clinic  Patient reports that for the past year he is having intermittent choking spells with wheezing.  Patient reports that he was doing lavage to clear his sinuses and started show to the point he could not breathe well.  This again happened when patient was once walking.  Patient reports something at back of his throat always present with frequent throat clearing.  Occasionally with solid food he will feel food sticking in the middle of his chest .he started PPI with minimal response.  The patient denies having any nausea, vomiting, fever, chills, hematochezia, melena, hematemesis, abdominal distention, abdominal pain, diarrhea, jaundice, pruritus or weight loss.  Last ZHI:wnwz Last Colonoscopy:01/2023  - Nine 3 to 8 mm polyps in the transverse colon, in the ascending colon and in the cecum, removed with a cold snare. Resected and retrieved. - One 1 mm polyp in the ascending colon, removed with a cold biopsy forceps. Resected and retrieved. - Three 3 to 6 mm polyps in the rectum and in the descending colon, removed with a cold snare. Resected and retrieved. - Diverticulosis in the sigmoid colon and in the descending colon. - Non- bleeding internal hemorrhoids.  Pathology showed 3 SSLs, 8 colonic tubular adenomas, 1 hyperplastic polyp, repeat colonoscopy in 3 years   Repeat 3 years   Labs from  02/2024 ferritin 119 iron saturation 26 vitamin B12 214 Past Medical History: Past Medical History:  Diagnosis Date   Hypertension    Sleep apnea     Past Surgical History: Past Surgical History:  Procedure Laterality Date   BIOPSY  01/29/2023   Procedure: BIOPSY;  Surgeon: Eartha Angelia Sieving, MD;  [facility];  Service: Gastroenterology;;   COLONOSCOPY N/A 11/29/2015   Procedure: COLONOSCOPY;  Surgeon: Claudis RAYMOND Rivet, MD;  [facility];  Service: Endoscopy;  Laterality: N/A;  930   COLONOSCOPY WITH PROPOFOL  N/A 01/29/2023   Procedure: COLONOSCOPY WITH PROPOFOL ;  Surgeon: Eartha Angelia Sieving, MD;  [facility];  Service: Gastroenterology;  Laterality: N/A;  1:30PM;ASA 1-2   MASS EXCISION Right 06/05/2017   Procedure: EXCISION SUBCUTANEOUS MASS RIGHT FLANK 8CM;  Surgeon: Mavis Anes, MD;  Location: [facility];  Service: General;  Laterality: Right;   NO PAST SURGERIES     POLYPECTOMY  01/29/2023   Procedure: POLYPECTOMY;  Surgeon: Eartha Angelia Sieving, MD;  [facility];  Service: Gastroenterology;;    Family History:History reviewed. No pertinent family history.  Social History:Tobacco Use History[1] Social History   Substance and Sexual Activity  Alcohol Use No   Social History   Substance and Sexual Activity  Drug Use No    Allergies: Allergies[2]  Medications: Current Outpatient Medications  Medication Sig Dispense Refill   amLODipine -olmesartan  (AZOR ) 5-20 MG tablet Take 1 tablet by mouth daily. 90 tablet 3   aspirin 81 MG chewable tablet Chew 81 mg by mouth.     Cholecalciferol 75 MCG (3000 UT) TABS Take 3,000  Units by mouth daily.     Cinnamon 500 MG TABS Take 500 mg by mouth daily.     GARLIC PO Take 1 capsule by mouth daily.     Omega-3 Fatty Acids (FISH OIL) 500 MG CAPS Take 500 mg by mouth daily.     pantoprazole  (PROTONIX ) 20 MG tablet Take 1 tablet (20 mg total) by mouth daily. 30 tablet 1    UNABLE TO FIND Med Name: Prostate Supplement     vitamin C (ASCORBIC ACID) 500 MG tablet Take 500 mg by mouth daily.     sildenafil  (VIAGRA ) 100 MG tablet 1/2 to 1 tablet p.o. as needed (Patient not taking: Reported on 07/27/2024) 20 tablet prn   No current facility-administered medications for this visit.    Review of Systems: GENERAL: negative for malaise, night sweats HEENT: No changes in hearing or vision, no nose bleeds or other nasal problems. NECK: Negative for lumps, goiter, pain and significant neck swelling RESPIRATORY: Negative for cough, wheezing CARDIOVASCULAR: Negative for chest pain, leg swelling, palpitations, orthopnea GI: SEE HPI MUSCULOSKELETAL: Negative for joint pain or swelling, back pain, and muscle pain. SKIN: Negative for lesions, rash HEMATOLOGY Negative for prolonged bleeding, bruising easily, and swollen nodes. ENDOCRINE: Negative for cold or heat intolerance, polyuria, polydipsia and goiter. NEURO: negative for tremor, gait imbalance, syncope and seizures. The remainder of the review of systems is noncontributory.   Physical Exam: BP 120/76   Pulse 67   Temp 97.9 F (36.6 C)   Ht 5' 7 (1.702 m)   Wt 229 lb 3.2 oz (104 kg)   BMI 35.90 kg/m  GENERAL: The patient is AO x3, in no acute distress. HEENT: Head is normocephalic and atraumatic. EOMI are intact. Mouth is well hydrated and without lesions. NECK: Supple. No masses LUNGS: Clear to auscultation. No presence of rhonchi/wheezing/rales. Adequate chest expansion HEART: RRR, normal s1 and s2. ABDOMEN: Soft, nontender, no guarding, no peritoneal signs, and nondistended. BS +. No masses.  Imaging/Labs: as above     Latest Ref Rng & Units 02/17/2024    8:47 AM 06/01/2017    2:52 PM  CBC  WBC 4.0 - 10.5 K/uL 5.5  7.7   Hemoglobin 13.0 - 17.0 g/dL 85.5  85.8   Hematocrit 39.0 - 52.0 % 43.3  41.2   Platelets 150 - 400 K/uL 232  263    Lab Results  Component Value Date   IRON 94 02/17/2024    TIBC 362 02/17/2024   FERRITIN 119 02/17/2024    I personally reviewed and interpreted the available labs, imaging and endoscopic files.  Impression and Plan:  Kurt Powell is a 63 y.o. male with hypertension, sleep apnea and colonic polyps who presents for evaluation of dysphagia/choking spells/globus sensation  #Dysphagia/Choking spells/Globus sensation  #Vitamin b12 boaderline Deficiency  From history I have a suspicion this may be disorder of oropharyngeal/laryngeal spasm  Nevertheless patient never had an upper endoscopy and does have occasional solid food dysphagia would benefit from upper endoscopy to evaluate for any underlying lesion such as web ring or stricture  Vitamin B12 less than 400 considered borderline deficiency  Upper endoscopy with dilation EGD with mapping biopsies for vitamin B12 deficiency  Recommend ENT evaluation and possible modified barium swallow especially if upper endoscopy is negative  PPI for now  - Cut food in small pieces and chew food thoroughly to avoid regurgitation episodes. - Discussed avoidance of eating within 3 hours of lying down to sleep and benefit of  blocks to elevate head of bed. Also, will benefit from avoiding carbonated drinks/sodas or food that has tomatoes, spicy or greasy food.  I thoroughly discussed with the patient the procedure, including the risks involved. Patient understands what the procedure involves including the benefits and any risks. Patient understands alternatives to the proposed procedure. Risks including (but not limited to) bleeding, tearing of the lining (perforation), rupture of adjacent organs, problems with heart and lung function, infection, and medication reactions. A small percentage of complications may require surgery, hospitalization, repeat endoscopic procedure, and/or transfusion.  Patient understood and agreed.   #colon polyps Last colonoscopy 2024 with 10 polyps suggest repeating 3 years   #BMI  35     - walking at a brisk pace/biking at moderate intesity 2.5-5 hours per week     - use pedometer/step counter to track activity     - goal to lose 5-10% of initial body weight     - avoid suagry drinks and juices, use zero calorie beverages     - increase water  intake     - eat a low carb diet with plenty of veggies and fruit     - Get sufficient sleep 7-8 hrs nightly     - maitain active lifestyle     - avoid alcohol     - Counsel on lowering cholesterol by having a diet rich in vegetables,          protein (avoid red meats) and good fats(fish, salmon).     All questions were answered.      Sabirin Baray Faizan Louine Tenpenny, MD Gastroenterology and Hepatology [facility] Gastroenterology   This chart has been completed using [facility] Dictation software, and while attempts have been made to ensure accuracy , certain words and phrases may not be transcribed as intended      [1]  Social History Tobacco Use  Smoking Status Former   Current packs/day: 0.00   Types: Cigarettes   Quit date: 11/29/1982   Years since quitting: 41.6  Smokeless Tobacco Never  [2] No Known Allergies

## 2024-07-27 NOTE — Progress Notes (Signed)
 Merridith Dershem Faizan Tenelle Andreason , M.D. Gastroenterology & Hepatology [facility] Gastroenterology [address] [place], [place] [address] Primary Care Physician: [name], MD [address] [place] [address]  Chief Complaint:   dysphagia/choking spells  History of Present Illness: Kurt Powell is a 63 y.o. male with hypertension, sleep apnea and colonic polyps who presents for evaluation of dysphagia/choking spells/globus sensation  Patient last screening colonoscopy directly scheduled and performed with Dr. Eartha.  This is the first time he has been seen in our clinic  Patient reports that for the past year he is having intermittent choking spells with wheezing.  Patient reports that he was doing lavage to clear his sinuses and started show to the point he could not breathe well.  This again happened when patient was once walking.  Patient reports something at back of his throat always present with frequent throat clearing.  Occasionally with solid food he will feel food sticking in the middle of his chest .he started PPI with minimal response.  The patient denies having any nausea, vomiting, fever, chills, hematochezia, melena, hematemesis, abdominal distention, abdominal pain, diarrhea, jaundice, pruritus or weight loss.  Last ZHI:wnwz Last Colonoscopy:01/2023  - Nine 3 to 8 mm polyps in the transverse colon, in the ascending colon and in the cecum, removed with a cold snare. Resected and retrieved. - One 1 mm polyp in the ascending colon, removed with a cold biopsy forceps. Resected and retrieved. - Three 3 to 6 mm polyps in the rectum and in the descending colon, removed with a cold snare. Resected and retrieved. - Diverticulosis in the sigmoid colon and in the descending colon. - Non- bleeding internal hemorrhoids.  Pathology showed 3 SSLs, 8 colonic tubular adenomas, 1 hyperplastic polyp, repeat colonoscopy in 3 years   Repeat 3 years   Labs from  02/2024 ferritin 119 iron saturation 26 vitamin B12 214 Past Medical History: Past Medical History:  Diagnosis Date   Hypertension    Sleep apnea     Past Surgical History: Past Surgical History:  Procedure Laterality Date   BIOPSY  01/29/2023   Procedure: BIOPSY;  Surgeon: Eartha Angelia Sieving, MD;  [facility];  Service: Gastroenterology;;   COLONOSCOPY N/A 11/29/2015   Procedure: COLONOSCOPY;  Surgeon: Claudis RAYMOND Rivet, MD;  [facility];  Service: Endoscopy;  Laterality: N/A;  930   COLONOSCOPY WITH PROPOFOL  N/A 01/29/2023   Procedure: COLONOSCOPY WITH PROPOFOL ;  Surgeon: Eartha Angelia Sieving, MD;  [facility];  Service: Gastroenterology;  Laterality: N/A;  1:30PM;ASA 1-2   MASS EXCISION Right 06/05/2017   Procedure: EXCISION SUBCUTANEOUS MASS RIGHT FLANK 8CM;  Surgeon: Mavis Anes, MD;  Location: [facility];  Service: General;  Laterality: Right;   NO PAST SURGERIES     POLYPECTOMY  01/29/2023   Procedure: POLYPECTOMY;  Surgeon: Eartha Angelia Sieving, MD;  [facility];  Service: Gastroenterology;;    Family History:History reviewed. No pertinent family history.  Social History:Tobacco Use History[1] Social History   Substance and Sexual Activity  Alcohol Use No   Social History   Substance and Sexual Activity  Drug Use No    Allergies: Allergies[2]  Medications: Current Outpatient Medications  Medication Sig Dispense Refill   amLODipine -olmesartan  (AZOR ) 5-20 MG tablet Take 1 tablet by mouth daily. 90 tablet 3   aspirin 81 MG chewable tablet Chew 81 mg by mouth.     Cholecalciferol 75 MCG (3000 UT) TABS Take 3,000  Units by mouth daily.     Cinnamon 500 MG TABS Take 500 mg by mouth daily.     GARLIC PO Take 1 capsule by mouth daily.     Omega-3 Fatty Acids (FISH OIL) 500 MG CAPS Take 500 mg by mouth daily.     pantoprazole  (PROTONIX ) 20 MG tablet Take 1 tablet (20 mg total) by mouth daily. 30 tablet 1    UNABLE TO FIND Med Name: Prostate Supplement     vitamin C (ASCORBIC ACID) 500 MG tablet Take 500 mg by mouth daily.     sildenafil  (VIAGRA ) 100 MG tablet 1/2 to 1 tablet p.o. as needed (Patient not taking: Reported on 07/27/2024) 20 tablet prn   No current facility-administered medications for this visit.    Review of Systems: GENERAL: negative for malaise, night sweats HEENT: No changes in hearing or vision, no nose bleeds or other nasal problems. NECK: Negative for lumps, goiter, pain and significant neck swelling RESPIRATORY: Negative for cough, wheezing CARDIOVASCULAR: Negative for chest pain, leg swelling, palpitations, orthopnea GI: SEE HPI MUSCULOSKELETAL: Negative for joint pain or swelling, back pain, and muscle pain. SKIN: Negative for lesions, rash HEMATOLOGY Negative for prolonged bleeding, bruising easily, and swollen nodes. ENDOCRINE: Negative for cold or heat intolerance, polyuria, polydipsia and goiter. NEURO: negative for tremor, gait imbalance, syncope and seizures. The remainder of the review of systems is noncontributory.   Physical Exam: BP 120/76   Pulse 67   Temp 97.9 F (36.6 C)   Ht 5' 7 (1.702 m)   Wt 229 lb 3.2 oz (104 kg)   BMI 35.90 kg/m  GENERAL: The patient is AO x3, in no acute distress. HEENT: Head is normocephalic and atraumatic. EOMI are intact. Mouth is well hydrated and without lesions. NECK: Supple. No masses LUNGS: Clear to auscultation. No presence of rhonchi/wheezing/rales. Adequate chest expansion HEART: RRR, normal s1 and s2. ABDOMEN: Soft, nontender, no guarding, no peritoneal signs, and nondistended. BS +. No masses.  Imaging/Labs: as above     Latest Ref Rng & Units 02/17/2024    8:47 AM 06/01/2017    2:52 PM  CBC  WBC 4.0 - 10.5 K/uL 5.5  7.7   Hemoglobin 13.0 - 17.0 g/dL 85.5  85.8   Hematocrit 39.0 - 52.0 % 43.3  41.2   Platelets 150 - 400 K/uL 232  263    Lab Results  Component Value Date   IRON 94 02/17/2024    TIBC 362 02/17/2024   FERRITIN 119 02/17/2024    I personally reviewed and interpreted the available labs, imaging and endoscopic files.  Impression and Plan:  Kurt Powell is a 63 y.o. male with hypertension, sleep apnea and colonic polyps who presents for evaluation of dysphagia/choking spells/globus sensation  #Dysphagia/Choking spells/Globus sensation  #Vitamin b12 boaderline Deficiency  From history I have a suspicion this may be disorder of oropharyngeal/laryngeal spasm  Nevertheless patient never had an upper endoscopy and does have occasional solid food dysphagia would benefit from upper endoscopy to evaluate for any underlying lesion such as web ring or stricture  Vitamin B12 less than 400 considered borderline deficiency  Upper endoscopy with dilation EGD with mapping biopsies for vitamin B12 deficiency  Recommend ENT evaluation and possible modified barium swallow especially if upper endoscopy is negative  PPI for now  - Cut food in small pieces and chew food thoroughly to avoid regurgitation episodes. - Discussed avoidance of eating within 3 hours of lying down to sleep and benefit of  blocks to elevate head of bed. Also, will benefit from avoiding carbonated drinks/sodas or food that has tomatoes, spicy or greasy food.  I thoroughly discussed with the patient the procedure, including the risks involved. Patient understands what the procedure involves including the benefits and any risks. Patient understands alternatives to the proposed procedure. Risks including (but not limited to) bleeding, tearing of the lining (perforation), rupture of adjacent organs, problems with heart and lung function, infection, and medication reactions. A small percentage of complications may require surgery, hospitalization, repeat endoscopic procedure, and/or transfusion.  Patient understood and agreed.   #colon polyps Last colonoscopy 2024 with 10 polyps suggest repeating 3 years   #BMI  35     - walking at a brisk pace/biking at moderate intesity 2.5-5 hours per week     - use pedometer/step counter to track activity     - goal to lose 5-10% of initial body weight     - avoid suagry drinks and juices, use zero calorie beverages     - increase water  intake     - eat a low carb diet with plenty of veggies and fruit     - Get sufficient sleep 7-8 hrs nightly     - maitain active lifestyle     - avoid alcohol     - Counsel on lowering cholesterol by having a diet rich in vegetables,          protein (avoid red meats) and good fats(fish, salmon).     All questions were answered.      Kurt Baray Faizan Louine Tenpenny, MD Gastroenterology and Hepatology [facility] Gastroenterology   This chart has been completed using [facility] Dictation software, and while attempts have been made to ensure accuracy , certain words and phrases may not be transcribed as intended      [1]  Social History Tobacco Use  Smoking Status Former   Current packs/day: 0.00   Types: Cigarettes   Quit date: 11/29/1982   Years since quitting: 41.6  Smokeless Tobacco Never  [2] No Known Allergies

## 2024-07-28 ENCOUNTER — Telehealth (INDEPENDENT_AMBULATORY_CARE_PROVIDER_SITE_OTHER): Payer: Self-pay

## 2024-07-28 NOTE — Telephone Encounter (Signed)
 PA not required for [employer] on [employer]

## 2024-07-28 NOTE — Telephone Encounter (Signed)
 Spoke with patient, scheduled EGD/DIL for 08/15/2024 at 11:30am. Instructions sent to patient on mychart and through the mail.

## 2024-08-03 ENCOUNTER — Ambulatory Visit: Payer: Self-pay | Admitting: Internal Medicine

## 2024-08-03 ENCOUNTER — Other Ambulatory Visit: Payer: Self-pay | Admitting: Internal Medicine

## 2024-08-03 DIAGNOSIS — G4733 Obstructive sleep apnea (adult) (pediatric): Secondary | ICD-10-CM | POA: Insufficient documentation

## 2024-08-03 NOTE — Assessment & Plan Note (Signed)
 Recent sleep study showed severe sleep apnea with 3% criteria - AHI: 37.2 CPAP ordered Can consider Zepbound - will discuss in the next office visit

## 2024-08-10 ENCOUNTER — Encounter (HOSPITAL_COMMUNITY): Payer: Self-pay

## 2024-08-10 ENCOUNTER — Other Ambulatory Visit: Payer: Self-pay

## 2024-08-10 ENCOUNTER — Encounter (HOSPITAL_COMMUNITY)
Admission: RE | Admit: 2024-08-10 | Discharge: 2024-08-10 | Disposition: A | Discharge status: Home / Self Care | Source: Ambulatory Visit | Attending: Gastroenterology | Admitting: Gastroenterology

## 2024-08-10 HISTORY — DX: Gastro-esophageal reflux disease without esophagitis: K21.9

## 2024-08-10 NOTE — Pre-Procedure Instructions (Signed)
 Attempted pre-op phone call. Left VM for him to call us back.

## 2024-08-15 ENCOUNTER — Encounter (HOSPITAL_COMMUNITY): Payer: Self-pay | Admitting: Gastroenterology

## 2024-08-15 ENCOUNTER — Ambulatory Visit (HOSPITAL_COMMUNITY)
Admission: RE | Admit: 2024-08-15 | Discharge: 2024-08-15 | Disposition: A | Discharge status: Home / Self Care | Attending: Gastroenterology | Admitting: Gastroenterology

## 2024-08-15 ENCOUNTER — Ambulatory Visit (HOSPITAL_COMMUNITY): Admitting: Anesthesiology

## 2024-08-15 ENCOUNTER — Encounter (HOSPITAL_COMMUNITY)
Admission: RE | Disposition: A | Discharge status: Home / Self Care | Payer: Self-pay | Source: Home / Self Care | Attending: Gastroenterology

## 2024-08-15 ENCOUNTER — Telehealth (INDEPENDENT_AMBULATORY_CARE_PROVIDER_SITE_OTHER): Payer: Self-pay | Admitting: *Deleted

## 2024-08-15 ENCOUNTER — Other Ambulatory Visit: Payer: Self-pay

## 2024-08-15 DIAGNOSIS — K2950 Unspecified chronic gastritis without bleeding: Secondary | ICD-10-CM | POA: Diagnosis not present

## 2024-08-15 DIAGNOSIS — R09A2 Foreign body sensation, throat: Secondary | ICD-10-CM

## 2024-08-15 DIAGNOSIS — Z87891 Personal history of nicotine dependence: Secondary | ICD-10-CM | POA: Diagnosis not present

## 2024-08-15 DIAGNOSIS — R0989 Other specified symptoms and signs involving the circulatory and respiratory systems: Secondary | ICD-10-CM | POA: Insufficient documentation

## 2024-08-15 DIAGNOSIS — K222 Esophageal obstruction: Secondary | ICD-10-CM

## 2024-08-15 DIAGNOSIS — I10 Essential (primary) hypertension: Secondary | ICD-10-CM | POA: Diagnosis not present

## 2024-08-15 DIAGNOSIS — K219 Gastro-esophageal reflux disease without esophagitis: Secondary | ICD-10-CM | POA: Diagnosis not present

## 2024-08-15 DIAGNOSIS — N289 Disorder of kidney and ureter, unspecified: Secondary | ICD-10-CM | POA: Insufficient documentation

## 2024-08-15 DIAGNOSIS — K2970 Gastritis, unspecified, without bleeding: Secondary | ICD-10-CM

## 2024-08-15 DIAGNOSIS — Z8719 Personal history of other diseases of the digestive system: Secondary | ICD-10-CM | POA: Diagnosis not present

## 2024-08-15 DIAGNOSIS — G4730 Sleep apnea, unspecified: Secondary | ICD-10-CM | POA: Diagnosis not present

## 2024-08-15 DIAGNOSIS — Z860100 Personal history of colon polyps, unspecified: Secondary | ICD-10-CM | POA: Diagnosis not present

## 2024-08-15 DIAGNOSIS — R1310 Dysphagia, unspecified: Secondary | ICD-10-CM | POA: Insufficient documentation

## 2024-08-15 HISTORY — PX: ESOPHAGEAL DILATION: SHX303

## 2024-08-15 HISTORY — PX: ESOPHAGOGASTRODUODENOSCOPY: SHX5428

## 2024-08-15 MED ORDER — LACTATED RINGERS IV SOLN: INTRAVENOUS | Status: DC | PRN

## 2024-08-15 MED ORDER — PROPOFOL 10 MG/ML IV BOLUS
INTRAVENOUS | Status: DC | PRN
  Administered 2024-08-15 (×2): 100 mg via INTRAVENOUS
  Administered 2024-08-15: 50 mg via INTRAVENOUS

## 2024-08-15 MED ORDER — LIDOCAINE 2% (20 MG/ML) 5 ML SYRINGE
INTRAMUSCULAR | Status: DC | PRN
  Administered 2024-08-15: 100 mg via INTRAVENOUS

## 2024-08-15 NOTE — Interval H&P Note (Signed)
 History and Physical Interval Note:  08/15/2024 10:58 AM  Kurt Powell  has presented today for surgery, with the diagnosis of dysphagia, globus sensation.  The various methods of treatment have been discussed with the patient and family. After consideration of risks, benefits and other options for treatment, the patient has consented to  Procedures with comments: EGD (ESOPHAGOGASTRODUODENOSCOPY) (N/A) - 11:30am, ASA any room DILATION, ESOPHAGUS (N/A) as a surgical intervention.  The patient's history has been reviewed, patient examined, no change in status, stable for surgery.  I have reviewed the patient's chart and labs.  Questions were answered to the patient's satisfaction.     Kurt Powell

## 2024-08-15 NOTE — Telephone Encounter (Signed)
 Referral sent, they will contact patient with apt

## 2024-08-15 NOTE — Anesthesia Preprocedure Evaluation (Signed)
"                                    Anesthesia Evaluation  Patient identified by MRN, date of birth, ID band Patient awake    Reviewed: Allergy & Precautions, H&P , NPO status , Patient's Chart, lab work & pertinent test results, reviewed documented beta blocker date and time   Airway Mallampati: II  TM Distance: >3 FB Neck ROM: full    Dental no notable dental hx.    Pulmonary neg pulmonary ROS, sleep apnea , former smoker   Pulmonary exam normal breath sounds clear to auscultation       Cardiovascular Exercise Tolerance: Good hypertension, negative cardio ROS  Rhythm:regular Rate:Normal     Neuro/Psych negative neurological ROS  negative psych ROS   GI/Hepatic negative GI ROS, Neg liver ROS,GERD  ,,  Endo/Other  negative endocrine ROS    Renal/GU Renal diseasenegative Renal ROS  negative genitourinary   Musculoskeletal   Abdominal   Peds  Hematology negative hematology ROS (+)   Anesthesia Other Findings   Reproductive/Obstetrics negative OB ROS                              Anesthesia Physical Anesthesia Plan  ASA: 2  Anesthesia Plan: MAC   Post-op Pain Management:    Induction:   PONV Risk Score and Plan: Propofol  infusion  Airway Management Planned:   Additional Equipment:   Intra-op Plan:   Post-operative Plan:   Informed Consent: I have reviewed the patients History and Physical, chart, labs and discussed the procedure including the risks, benefits and alternatives for the proposed anesthesia with the patient or authorized representative who has indicated his/her understanding and acceptance.     Dental Advisory Given  Plan Discussed with: CRNA  Anesthesia Plan Comments:         Anesthesia Quick Evaluation  "

## 2024-08-15 NOTE — Transfer of Care (Signed)
 Immediate Anesthesia Transfer of Care Note  Patient: Kurt Powell  Procedure(s) Performed: EGD (ESOPHAGOGASTRODUODENOSCOPY) DILATION, ESOPHAGUS  Patient Location: Endoscopy Unit  Anesthesia Type:MAC  Level of Consciousness: drowsy  Airway & Oxygen Therapy: Patient Spontanous Breathing  Post-op Assessment: Report given to RN and Post -op Vital signs reviewed and stable  Post vital signs: Reviewed and stable  Last Vitals:  Vitals Value Taken Time  BP 91/64 08/15/24 11:40  Temp 36.8 C 08/15/24 11:40  Pulse 81 08/15/24 11:40  Resp 15 08/15/24 11:40  SpO2 97 % 08/15/24 11:40    Last Pain:  Vitals:   08/15/24 1140  TempSrc: Oral  PainSc: Asleep      Patients Stated Pain Goal: 5 (08/15/24 1046)  Complications: There were no known notable events for this encounter.

## 2024-08-15 NOTE — Op Note (Signed)
 [facility] Patient Name: Kurt Powell Procedure Date: 08/15/2024 10:42 AM MRN: [id number] Date of Birth: 1961-01-21 Attending MD: Deatrice Dine , MD, [id number] CSN: [id number] Age: 64 Admit Type: Outpatient Procedure:                Upper GI endoscopy Indications:              Dysphagia, Globus sensation, recurrent choking spell Providers:                Deatrice Dine, MD, Devere Lodge, Bascom Blush Referring MD:              Medicines:                Monitored Anesthesia Care Complications:            No immediate complications. Estimated Blood Loss:     Estimated blood loss: none. Estimated blood loss                            was minimal. Procedure:                Pre-Anesthesia Assessment:                           - Prior to the procedure, a History and Physical                            was performed, and patient medications and                            allergies were reviewed. The patient's tolerance of                            previous anesthesia was also reviewed. The risks                            and benefits of the procedure and the sedation                            options and risks were discussed with the patient.                            All questions were answered, and informed consent                            was obtained. Prior Anticoagulants: The patient has                            taken no anticoagulant or antiplatelet agents. ASA                            Grade Assessment: II - A patient with mild systemic                            disease. After reviewing the risks and benefits,  the patient was deemed in satisfactory condition to                            undergo the procedure.                           After obtaining informed consent, the endoscope was                            passed under direct vision. Throughout the                            procedure, the patient's blood pressure, pulse, and                             oxygen saturations were monitored continuously. The                            [id number] ([id number]) Upper was introduced through                            the mouth, and advanced to the second part of                            duodenum. The upper GI endoscopy was accomplished                            without difficulty. The patient tolerated the                            procedure well. Scope In: 11:29:47 AM Scope Out: 11:33:55 AM Total Procedure Duration: 0 hours 4 minutes 8 seconds  Findings:      No endoscopic abnormality was evident in the esophagus to explain the       patient's complaint of dysphagia. It was decided, however, to proceed       with dilation of the entire esophagus. A TTS dilator was passed through       the scope. Dilation with a 15-16.5-18 mm balloon dilator was performed       to 18 mm. The dilation site was examined following endoscope reinsertion       and showed mild mucosal disruption. Biopsies were obtained from the       proximal and distal esophagus with cold forceps for histology of       suspected eosinophilic esophagitis.      Mildly erythematous mucosa without bleeding was found in the gastric       antrum. Biopsies were taken with a cold forceps for histology.      The duodenal bulb and second portion of the duodenum were normal. Impression:               - No endoscopic esophageal abnormality to explain                            patient's dysphagia. Esophagus dilated. Dilated.                           -  Erythematous mucosa in the antrum. Biopsied.                           - Normal duodenal bulb and second portion of the                            duodenum.                           - Biopsies were taken with a cold forceps for                            evaluation of eosinophilic esophagitis. Moderate Sedation:      Per Anesthesia Care Recommendation:           - Patient has a contact number available for                             emergencies. The signs and symptoms of potential                            delayed complications were discussed with the                            patient. Return to normal activities tomorrow.                            Written discharge instructions were provided to the                            patient.                           - Resume previous diet.                           - Continue present medications.                           - Await pathology results.                           -Patient had choking spell during endoscopy as well                            while the scope was in the stomach; appears to be                            laryngospasm, would recommend SLP eval and modified                            barium swallow Procedure Code(s):        --- Professional ---                           [phone], Esophagogastroduodenoscopy, flexible,  transoral; with transendoscopic balloon dilation of                            esophagus (less than 30 mm diameter)                           43239, 59, Esophagogastroduodenoscopy, flexible,                            transoral; with biopsy, single or multiple Diagnosis Code(s):        --- Professional ---                           R13.10, Dysphagia, unspecified                           K31.89, Other diseases of stomach and duodenum                           F45.8, Other somatoform disorders CPT copyright 2022 American [employer] Association. All rights reserved. The codes documented in this report are preliminary and upon coder review may  be revised to meet current compliance requirements. Deatrice Dine, MD Deatrice Dine, MD 08/15/2024 11:44:56 AM This report has been signed electronically. Number of Addenda: 0

## 2024-08-15 NOTE — Discharge Instructions (Signed)

## 2024-08-15 NOTE — Telephone Encounter (Signed)
-----   Message from Deatrice Dine, MD sent at 08/15/2024 11:34 AM EST ----- Regarding: reff Hi Keymarion Bearman ,  Can you please arrange a referral for this patient to Speech and swallow eval (SLP)  Diagnosis:  Choking spells, oropharyngeal dysphagia, obtain eval and MBS    Thanks,  Muhammad Faizan Ahmed, MD Gastroenterology and Hepatology [facility]

## 2024-08-15 NOTE — Anesthesia Postprocedure Evaluation (Signed)
"   Anesthesia Post Note  Patient: Kurt Powell  Procedure(s) Performed: EGD (ESOPHAGOGASTRODUODENOSCOPY) DILATION, ESOPHAGUS  Patient location during evaluation: Phase II Anesthesia Type: MAC Level of consciousness: awake Pain management: pain level controlled Vital Signs Assessment: post-procedure vital signs reviewed and stable Respiratory status: spontaneous breathing and respiratory function stable Cardiovascular status: blood pressure returned to baseline and stable Postop Assessment: no headache and no apparent nausea or vomiting Anesthetic complications: no Comments: Late entry   There were no known notable events for this encounter.   Last Vitals:  Vitals:   08/15/24 1140 08/15/24 1144  BP: 91/64 103/66  Pulse: 81   Resp: 15   Temp: 36.8 C   SpO2: 97%     Last Pain:  Vitals:   08/15/24 1140  TempSrc: Oral  PainSc: Asleep                 Yvonna PARAS Jehan Ranganathan      "

## 2024-08-16 ENCOUNTER — Other Ambulatory Visit (HOSPITAL_COMMUNITY): Payer: Self-pay | Admitting: Occupational Therapy

## 2024-08-16 ENCOUNTER — Encounter (HOSPITAL_COMMUNITY): Payer: Self-pay | Admitting: Gastroenterology

## 2024-08-16 DIAGNOSIS — R1312 Dysphagia, oropharyngeal phase: Secondary | ICD-10-CM

## 2024-08-16 DIAGNOSIS — T17308A Unspecified foreign body in larynx causing other injury, initial encounter: Secondary | ICD-10-CM

## 2024-08-17 LAB — SURGICAL PATHOLOGY

## 2024-08-19 ENCOUNTER — Ambulatory Visit (INDEPENDENT_AMBULATORY_CARE_PROVIDER_SITE_OTHER): Payer: Self-pay | Admitting: Gastroenterology

## 2024-08-24 ENCOUNTER — Encounter (INDEPENDENT_AMBULATORY_CARE_PROVIDER_SITE_OTHER): Payer: Self-pay

## 2024-08-24 ENCOUNTER — Ambulatory Visit (INDEPENDENT_AMBULATORY_CARE_PROVIDER_SITE_OTHER)

## 2024-08-24 VITALS — BP 137/87 | HR 68 | Ht 67.0 in | Wt 230.0 lb

## 2024-08-24 DIAGNOSIS — H9313 Tinnitus, bilateral: Secondary | ICD-10-CM

## 2024-08-24 DIAGNOSIS — R09A2 Foreign body sensation, throat: Secondary | ICD-10-CM

## 2024-08-24 DIAGNOSIS — J385 Laryngeal spasm: Secondary | ICD-10-CM | POA: Diagnosis not present

## 2024-08-24 NOTE — Patient Instructions (Addendum)
 Laryngospasm  Sniff sniff hiss

## 2024-08-24 NOTE — Progress Notes (Signed)
 Dear Dr. Tobie, Here is my assessment for our mutual patient, Kurt Powell. Thank you for allowing me the opportunity to care for your patient. Please do not hesitate to contact me should you have any other questions. Sincerely, Dr. Hadassah Parody  Otolaryngology Clinic Note Referring provider: Dr. Tobie HPI:   Initial HPI (08/24/2024)  64 year old male with obstructive sleep apnea who presents for evaluation of recurrent choking spells.  Over there past few months (since October 2025) he had had recurrent episodes of abrupt airway closure lasting 30 to 45 seconds.  These have occurred for multiple different reasons.  1 time occurred after he used a nasal lavage, another occurred while using his CPAP device when sleeping, others occurred with no obvious trigger or with physical activity like walking upstairs.  No association between swallowing and a choking episode.  He feels like his throat is just very sensitive.  Sometimes has a sensation as a foreign body in his throat  This all seem to start after he was sick in October 2025 with a upper respiratory tract infection.  He has had other episodes in the past prior to this and in past years but they are now getting worse ever since his sickness in October.  He discontinued his CPAP 1 to 2 months ago because of this sensation of choking when using the device.  He was given Protonix  20 mg daily but discontinued this as he did not feel like it was helping.  Additionally patient is reporting bilateral constant tinnitus.  No current ear fullness or hearing loss.  Was wondering if this could be due to a cerumen impaction.  He has an upcoming MBS and appointment with speech on 08/31/2024  Independent Review of Additional Tests or Records:  GI note 07/27/2024 Deatrice Dine, MD: Seen by gastroenterology for dysphagia and choking spells, globus sensation frequent throat clearing  08/16/2023 upper endoscopy note reviewed: Patient noted to cough often  during the study.  Esophageal dilation performed.  PMH/Meds/All/SocHx/FamHx/ROS:   Past Medical History:  Diagnosis Date   GERD (gastroesophageal reflux disease)    Hypertension    Sleep apnea      Past Surgical History:  Procedure Laterality Date   BIOPSY  01/29/2023   Procedure: BIOPSY;  Surgeon: Eartha Angelia Sieving, MD;  [facility]: [facility];  Service: Gastroenterology;;   COLONOSCOPY N/A 11/29/2015   Procedure: COLONOSCOPY;  Surgeon: Claudis RAYMOND Rivet, MD;  [facility]: [facility];  Service: Endoscopy;  Laterality: N/A;  930   COLONOSCOPY WITH PROPOFOL  N/A 01/29/2023   Procedure: COLONOSCOPY WITH PROPOFOL ;  Surgeon: Eartha Angelia Sieving, MD;  Location: [facility];  Service: Gastroenterology;  Laterality: N/A;  1:30PM;ASA 1-2   ESOPHAGEAL DILATION N/A 08/15/2024   Procedure: DILATION, ESOPHAGUS;  Surgeon: Dine Deatrice FALCON, MD;  [facility]: [facility];  Service: Endoscopy;  Laterality: N/A;   ESOPHAGOGASTRODUODENOSCOPY N/A 08/15/2024   Procedure: EGD (ESOPHAGOGASTRODUODENOSCOPY);  Surgeon: Dine Deatrice FALCON, MD;  [facility]: [facility];  Service: Endoscopy;  Laterality: N/A;  11:30am, ASA any room   MASS EXCISION Right 06/05/2017   Procedure: EXCISION SUBCUTANEOUS MASS RIGHT FLANK 8CM;  Surgeon: Mavis Anes, MD;  Location: [facility];  Service: General;  Laterality: Right;   POLYPECTOMY  01/29/2023   Procedure: POLYPECTOMY;  Surgeon: Eartha Angelia Sieving, MD;  [facility]: [facility];  Service: Gastroenterology;;    No family history on file.   Social Connections: Not on file     Current Outpatient Medications  Medication Instructions  amLODipine -olmesartan  (AZOR ) 5-20 MG tablet 1 tablet, Oral, Daily   ascorbic acid (VITAMIN C) 500 mg, Daily   aspirin 81 mg   Cholecalciferol 3,000 Units, Daily   Cinnamon 500 mg, Daily   Fish Oil 500 mg, Daily   GARLIC PO 1 capsule, Daily   pantoprazole  (PROTONIX ) 20 mg, Oral, Daily   sildenafil  (VIAGRA ) 100 MG tablet  1/2 to 1 tablet p.o. as needed   UNABLE TO FIND Med Name: Prostate Supplement     Physical Exam:   BP 137/87 (BP Location: Right Arm, Patient Position: Sitting)   Pulse 68   Ht 5' 7 (1.702 m)   Wt 230 lb (104.3 kg)   SpO2 93%   BMI 36.02 kg/m   Salient findings:  CN II-XII intact   Bilateral EAC clear and TM intact with well pneumatized middle ear spaces  Anterior rhinoscopy: Septum slight leftward deviation; bilateral inferior turbinates with mild hypertrophy  No lesions of oral cavity/oropharynx  No obviously palpable neck masses/lymphadenopathy/thyromegaly  No respiratory distress or stridor TFL was indicated to better evaluate the proximal airway, given the patient's history and exam findings, and is detailed below.   Seprately Identifiable Procedures:  Prior to initiating any procedures, risks/benefits/alternatives were explained to the patient and verbal consent obtained.  Procedure Note (08/24/2024) Pre-procedure diagnosis:  globus sensation Post-procedure diagnosis: Same Procedure: Transnasal Fiberoptic Laryngoscopy, CPT 31575 - Mod 25 Indication: Globus sensation Complications: None apparent EBL: 0 mL  The procedure was undertaken to further evaluate the patient's complaint of globus sensation, with mirror exam inadequate for appropriate examination due to gag reflex and poor patient tolerance  Procedure:  Patient was identified as correct patient. Verbal consent was obtained. The nose was sprayed with oxymetazoline and 4% lidocaine . The The flexible laryngoscope was passed through the nose to view the nasal cavity, pharynx (oropharynx, hypopharynx) and larynx.  The larynx was examined at rest and during multiple phonatory tasks. Documentation was obtained and reviewed with patient. The scope was removed. The patient tolerated the procedure well.  Findings: The nasal cavity and nasopharynx did not reveal any masses or lesions, mucosa appeared to be without obvious  lesions. The tongue base, pharyngeal walls, piriform sinuses, vallecula, epiglottis and postcricoid region are normal in appearance. The visualized portion of the subglottis and proximal trachea is widely patent. The vocal folds are mobile bilaterally. There are no lesions on the free edge of the vocal folds nor elsewhere in the larynx worrisome for malignancy.    Electronically signed by: Hadassah JAYSON Parody, MD 08/24/2024 2:56 PM   Impression & Plans:  Kiondre Grenz is a 64 y.o. male with   1. Laryngospasm   2. Tinnitus of both ears   3. Globus sensation    Assessment and Plan Assessment & Plan Laryngospasm Globus sensation  He experiences recurrent episodes of airway closure and choking consistent with laryngospasm, likely multifactorial in etiology. Contributing factors include heightened laryngeal sensitivity post-upper respiratory illness and possible reflux, though reflux therapy has not provided relief (although he was on 20 mg daily and is possible he needed increased dose) GERD is still less likely given that his symptoms do not happen solely at nighttime.  CPAP use may have exacerbated symptoms.  Flexible laryngoscopy demonstrated normal laryngeal anatomy and function, with no evidence of obstruction or epiglottic dysfunction. The condition is distressing but not associated with structural abnormality. - Provided education regarding laryngospasm pathophysiology. - Recommended discussion of voice therapy strategies with SLP.  I have recommended voice therapy referral.  He has an upcoming swallow study and plans to discuss with the swallow therapist to see if they have any recommendations.  If not he will call for voice therapy referral. - Instructed on 'sniff, sniff, hiss' maneuver for acute management of laryngospasm episodes. - Advised monitoring for reflux symptoms and consideration of further reflux management if symptoms worsen.  Bilateral tinnitus He has persistent bilateral  tinnitus, most likely secondary to age-related sensorineural hearing loss, but will need an audiogram to further investigate.  Otoscopic examination revealed healthy ear canals with minimal cerumen and no evidence of infection or obstruction. - Performed otoscopic examination; no significant cerumen or pathology identified. - Discussed likely etiology as age-related hearing loss and rationale for audiometric evaluation. - Ordered hearing test to establish baseline and further evaluate etiology. - Advised follow-up after hearing test to review results and discuss further management if indicated.    See below regarding exact medications prescribed this encounter including dosages and route: No orders of the defined types were placed in this encounter.   Thank you for allowing me the opportunity to care for your patient. Please do not hesitate to contact me should you have any other questions.  Sincerely, Hadassah Parody, MD Otolaryngologist (ENT), [facility] Phone: [phone] Fax: [phone]  MDM:  Level 4 Complexity/Problems addressed: 4-chronic worsening problem Data complexity: 4-  independent review of GI note, endoscopy notes, ordered audiogram - Morbidity: -   - Prescription Drug prescribed or managed: No

## 2024-08-25 NOTE — Progress Notes (Signed)
 Patient result letter mailed Patient's PCP is on EPIC

## 2024-08-31 ENCOUNTER — Encounter (HOSPITAL_COMMUNITY): Payer: Self-pay | Admitting: Speech Pathology

## 2024-08-31 ENCOUNTER — Other Ambulatory Visit: Payer: Self-pay

## 2024-08-31 ENCOUNTER — Ambulatory Visit (HOSPITAL_COMMUNITY)
Admission: RE | Admit: 2024-08-31 | Discharge: 2024-08-31 | Disposition: A | Discharge status: Home / Self Care | Source: Ambulatory Visit | Attending: Gastroenterology | Admitting: Gastroenterology

## 2024-08-31 ENCOUNTER — Ambulatory Visit (HOSPITAL_COMMUNITY): Attending: Gastroenterology | Admitting: Speech Pathology

## 2024-08-31 DIAGNOSIS — T17308A Unspecified foreign body in larynx causing other injury, initial encounter: Secondary | ICD-10-CM | POA: Insufficient documentation

## 2024-08-31 DIAGNOSIS — R1319 Other dysphagia: Secondary | ICD-10-CM | POA: Insufficient documentation

## 2024-08-31 DIAGNOSIS — R1312 Dysphagia, oropharyngeal phase: Secondary | ICD-10-CM | POA: Insufficient documentation

## 2024-08-31 NOTE — Therapy (Signed)
 Modified Barium Swallow Study  Patient Details  Name: Kurt Powell MRN: [id number] Date of Birth: 16-Apr-1961  Today's Date: 08/31/2024  Modified Barium Swallow completed.  Full report located under Chart Review in the Imaging Section.  History of Present Illness Pt referred for MBS d/t choking episodes per pt report.  Pt recently began medication for GERD as a precaution and an EGD was completed on 08/15/24 with esophageal dilation performed.  ENT visit on 08/24/24 noting episodes of airway closure lasting 30-45 seconds precipitated by nasal lavage, CPAP and pt stating he has  a sensitive throat.  ST consulted to assess swallow function.   Clinical Impression Pt exhibited a normal oropharyngeal swallow.  He demonstrated brisk tongue motion, adequate mastication and timely swallow at the posterior angle of the ramus.  Complete oral/pharyngeal clearance and adequte hyolaryngeal elevation/airway closure with no aspiration/penetration seen throughout the study regardless of consistency assessed.  Esophageal distension/clearance and UES opening normal.  Provided pt with esophageal precautions to utilize with regular/thin liquid diet to assist with reported intermittent globus sensation, although this was not observed during this study. No further ST recommended. Thank you for this consult. Factors that may increase risk of adverse event in presence of aspiration Noe & Lianne 2021): Poor general health and/or compromised immunity;Reduced cognitive function;Aspiration of thick, dense, and/or acidic materials;Frequent aspiration of large volumes DIGEST Swallow Severity Rating*  Safety: 1  Efficiency:1  Overall Pharyngeal Swallow Severity: mild 1: mild; 2: moderate; 3: severe; 4: profound  *The Dynamic Imaging Grade of Swallowing Toxicity is standardized for the head and neck cancer population, however, demonstrates promising clinical applications across populations to standardize the clinical  rating of pharyngeal swallow safety and severity.   Swallow Evaluation Recommendations Recommendations: PO diet PO Diet Recommendation: Regular;Thin liquids (Level 0) Liquid Administration via: Cup;Straw Medication Administration: Whole meds with liquid Supervision: Patient able to self-feed Swallowing strategies  : Multiple dry swallows after each bite/sip;Follow solids with liquids Postural changes: Position pt fully upright for meals;Stay upright 30-60 min after meals Oral care recommendations: Oral care BID (2x/day);Pt independent with oral care      Pat Rubena Roseman,M.S.,CCC-SLP 08/31/2024,12:33 PM

## 2024-10-24 ENCOUNTER — Ambulatory Visit (INDEPENDENT_AMBULATORY_CARE_PROVIDER_SITE_OTHER)

## 2024-10-24 ENCOUNTER — Ambulatory Visit (INDEPENDENT_AMBULATORY_CARE_PROVIDER_SITE_OTHER): Admitting: Audiology

## 2024-11-01 ENCOUNTER — Ambulatory Visit: Admitting: Internal Medicine

## 2024-12-13 ENCOUNTER — Ambulatory Visit: Admitting: Urology
